# Patient Record
Sex: Male | Born: 1935 | Race: White | Hispanic: No | Marital: Single | Smoking: Never smoker
Health system: Southern US, Community
[De-identification: ages and names within clinical notes are randomized; demographics above are authoritative.]

## PROBLEM LIST (undated history)

## (undated) DIAGNOSIS — N186 End stage renal disease: Secondary | ICD-10-CM

## (undated) DIAGNOSIS — N289 Disorder of kidney and ureter, unspecified: Secondary | ICD-10-CM

## (undated) DIAGNOSIS — Z992 Dependence on renal dialysis: Secondary | ICD-10-CM

## (undated) DIAGNOSIS — I10 Essential (primary) hypertension: Secondary | ICD-10-CM

## (undated) HISTORY — PX: HIP SURGERY: SHX245

## (undated) HISTORY — PX: AV FISTULA PLACEMENT: SHX1204

---

## 2012-11-17 ENCOUNTER — Emergency Department: Payer: Self-pay | Admitting: Emergency Medicine

## 2012-11-17 LAB — URINALYSIS, COMPLETE
Bacteria: NONE SEEN
Bilirubin,UR: NEGATIVE
Hyaline Cast: 1
Ketone: NEGATIVE
Ph: 9 (ref 4.5–8.0)
RBC,UR: 1 /HPF (ref 0–5)
Specific Gravity: 1.01 (ref 1.003–1.030)
Squamous Epithelial: NONE SEEN
WBC UR: 2 /HPF (ref 0–5)

## 2012-11-17 LAB — COMPREHENSIVE METABOLIC PANEL
Albumin: 3.2 g/dL — ABNORMAL LOW (ref 3.4–5.0)
Alkaline Phosphatase: 80 U/L (ref 50–136)
Calcium, Total: 8.4 mg/dL — ABNORMAL LOW (ref 8.5–10.1)
Creatinine: 5.5 mg/dL — ABNORMAL HIGH (ref 0.60–1.30)
EGFR (African American): 10 — ABNORMAL LOW
EGFR (Non-African Amer.): 9 — ABNORMAL LOW
Glucose: 155 mg/dL — ABNORMAL HIGH (ref 65–99)
Osmolality: 284 (ref 275–301)
Potassium: 5 mmol/L (ref 3.5–5.1)
Total Protein: 6.2 g/dL — ABNORMAL LOW (ref 6.4–8.2)

## 2012-11-17 LAB — CBC
HCT: 33.4 % — ABNORMAL LOW (ref 40.0–52.0)
MCH: 31.2 pg (ref 26.0–34.0)
WBC: 4.9 10*3/uL (ref 3.8–10.6)

## 2013-03-19 ENCOUNTER — Emergency Department: Payer: Self-pay | Admitting: Emergency Medicine

## 2013-03-19 LAB — BASIC METABOLIC PANEL
Calcium, Total: 8.4 mg/dL — ABNORMAL LOW (ref 8.5–10.1)
Chloride: 101 mmol/L (ref 98–107)
Co2: 33 mmol/L — ABNORMAL HIGH (ref 21–32)
EGFR (African American): 14 — ABNORMAL LOW
Sodium: 139 mmol/L (ref 136–145)

## 2013-03-19 LAB — CBC
HGB: 11.3 g/dL — ABNORMAL LOW (ref 13.0–18.0)
MCV: 101 fL — ABNORMAL HIGH (ref 80–100)
Platelet: 62 10*3/uL — ABNORMAL LOW (ref 150–440)
RBC: 3.58 10*6/uL — ABNORMAL LOW (ref 4.40–5.90)
RDW: 15.1 % — ABNORMAL HIGH (ref 11.5–14.5)
WBC: 4.6 10*3/uL (ref 3.8–10.6)

## 2013-03-19 LAB — TROPONIN I: Troponin-I: 0.02 ng/mL

## 2013-04-22 ENCOUNTER — Ambulatory Visit: Payer: Self-pay | Admitting: Hematology and Oncology

## 2013-04-22 LAB — CBC CANCER CENTER
Basophil #: 0 x10 3/mm (ref 0.0–0.1)
Eosinophil %: 4 %
Lymphocyte #: 0.8 x10 3/mm — ABNORMAL LOW (ref 1.0–3.6)
MCH: 33.4 pg (ref 26.0–34.0)
MCV: 102 fL — ABNORMAL HIGH (ref 80–100)
Monocyte #: 0.2 x10 3/mm (ref 0.2–1.0)
Monocyte %: 5.4 %
Neutrophil #: 2.9 x10 3/mm (ref 1.4–6.5)
Neutrophil %: 70.2 %
Platelet: 113 x10 3/mm — ABNORMAL LOW (ref 150–440)
RBC: 3.22 10*6/uL — ABNORMAL LOW (ref 4.40–5.90)
RDW: 15.7 % — ABNORMAL HIGH (ref 11.5–14.5)
WBC: 4.2 x10 3/mm (ref 3.8–10.6)

## 2013-04-22 LAB — IRON AND TIBC
Iron Saturation: 36 %
Iron: 63 ug/dL — ABNORMAL LOW (ref 65–175)
Unbound Iron-Bind.Cap.: 110 ug/dL

## 2013-04-22 LAB — FOLATE: Folic Acid: 82 ng/mL (ref 3.1–100.0)

## 2013-05-10 ENCOUNTER — Ambulatory Visit: Payer: Self-pay | Admitting: Hematology and Oncology

## 2013-06-08 LAB — CBC CANCER CENTER
Basophil #: 0 x10 3/mm (ref 0.0–0.1)
Eosinophil #: 0.2 x10 3/mm (ref 0.0–0.7)
Eosinophil %: 2.7 %
HCT: 32.8 % — ABNORMAL LOW (ref 40.0–52.0)
Lymphocyte #: 0.9 x10 3/mm — ABNORMAL LOW (ref 1.0–3.6)
Lymphocyte %: 15.2 %
MCH: 34.3 pg — ABNORMAL HIGH (ref 26.0–34.0)
MCV: 106 fL — ABNORMAL HIGH (ref 80–100)
Monocyte #: 0.3 x10 3/mm (ref 0.2–1.0)
Monocyte %: 4.8 %
Neutrophil #: 4.4 x10 3/mm (ref 1.4–6.5)
RBC: 3.09 10*6/uL — ABNORMAL LOW (ref 4.40–5.90)
RDW: 15.7 % — ABNORMAL HIGH (ref 11.5–14.5)
WBC: 5.8 x10 3/mm (ref 3.8–10.6)

## 2013-06-09 ENCOUNTER — Ambulatory Visit: Payer: Self-pay | Admitting: Hematology and Oncology

## 2013-06-20 ENCOUNTER — Emergency Department: Payer: Self-pay | Admitting: Emergency Medicine

## 2013-06-21 ENCOUNTER — Observation Stay: Payer: Self-pay | Admitting: Internal Medicine

## 2013-06-21 LAB — BASIC METABOLIC PANEL
BUN: 42 mg/dL — ABNORMAL HIGH (ref 7–18)
Chloride: 96 mmol/L — ABNORMAL LOW (ref 98–107)
Co2: 33 mmol/L — ABNORMAL HIGH (ref 21–32)
Creatinine: 5.3 mg/dL — ABNORMAL HIGH (ref 0.60–1.30)
EGFR (African American): 10 — ABNORMAL LOW
EGFR (Non-African Amer.): 9 — ABNORMAL LOW
Osmolality: 283 (ref 275–301)
Potassium: 4.3 mmol/L (ref 3.5–5.1)
Sodium: 135 mmol/L — ABNORMAL LOW (ref 136–145)

## 2013-06-21 LAB — TROPONIN I
Troponin-I: 0.04 ng/mL
Troponin-I: 0.04 ng/mL

## 2013-06-21 LAB — CBC
HCT: 31.7 % — ABNORMAL LOW (ref 40.0–52.0)
MCH: 35.9 pg — ABNORMAL HIGH (ref 26.0–34.0)
MCHC: 34 g/dL (ref 32.0–36.0)
RBC: 3 10*6/uL — ABNORMAL LOW (ref 4.40–5.90)

## 2013-06-22 DIAGNOSIS — I519 Heart disease, unspecified: Secondary | ICD-10-CM

## 2013-06-22 LAB — CBC WITH DIFFERENTIAL/PLATELET
Basophil #: 0 10*3/uL (ref 0.0–0.1)
Basophil %: 0.6 %
Eosinophil #: 0.2 10*3/uL (ref 0.0–0.7)
Eosinophil %: 2.6 %
HGB: 10 g/dL — ABNORMAL LOW (ref 13.0–18.0)
Lymphocyte #: 1.2 10*3/uL (ref 1.0–3.6)
Lymphocyte %: 18 %
MCH: 36.1 pg — ABNORMAL HIGH (ref 26.0–34.0)
MCHC: 34 g/dL (ref 32.0–36.0)
MCV: 106 fL — ABNORMAL HIGH (ref 80–100)
Monocyte #: 0.4 x10 3/mm (ref 0.2–1.0)
Neutrophil #: 5 10*3/uL (ref 1.4–6.5)
RBC: 2.77 10*6/uL — ABNORMAL LOW (ref 4.40–5.90)
RDW: 15.6 % — ABNORMAL HIGH (ref 11.5–14.5)
WBC: 6.8 10*3/uL (ref 3.8–10.6)

## 2013-06-22 LAB — CK-MB: CK-MB: 4.8 ng/mL — ABNORMAL HIGH (ref 0.5–3.6)

## 2013-06-22 LAB — BASIC METABOLIC PANEL
Anion Gap: 10 (ref 7–16)
BUN: 53 mg/dL — ABNORMAL HIGH (ref 7–18)
Calcium, Total: 8.5 mg/dL (ref 8.5–10.1)
Chloride: 96 mmol/L — ABNORMAL LOW (ref 98–107)
Glucose: 93 mg/dL (ref 65–99)
Osmolality: 286 (ref 275–301)
Sodium: 136 mmol/L (ref 136–145)

## 2013-06-22 LAB — TROPONIN I: Troponin-I: 0.04 ng/mL

## 2013-06-23 LAB — CBC
HCT: 31 % — ABNORMAL LOW (ref 40.0–52.0)
MCH: 35.9 pg — ABNORMAL HIGH (ref 26.0–34.0)
MCHC: 33.9 g/dL (ref 32.0–36.0)
MCV: 106 fL — ABNORMAL HIGH (ref 80–100)
Platelet: 111 10*3/uL — ABNORMAL LOW (ref 150–440)
RBC: 2.93 10*6/uL — ABNORMAL LOW (ref 4.40–5.90)
RDW: 16 % — ABNORMAL HIGH (ref 11.5–14.5)
WBC: 7.9 10*3/uL (ref 3.8–10.6)

## 2013-06-23 LAB — COMPREHENSIVE METABOLIC PANEL
Albumin: 3.1 g/dL — ABNORMAL LOW (ref 3.4–5.0)
Anion Gap: 6 — ABNORMAL LOW (ref 7–16)
Bilirubin,Total: 0.6 mg/dL (ref 0.2–1.0)
Calcium, Total: 8.7 mg/dL (ref 8.5–10.1)
Chloride: 97 mmol/L — ABNORMAL LOW (ref 98–107)
EGFR (African American): 16 — ABNORMAL LOW
Osmolality: 277 (ref 275–301)
Potassium: 4.2 mmol/L (ref 3.5–5.1)
SGPT (ALT): 24 U/L (ref 12–78)
Total Protein: 6.6 g/dL (ref 6.4–8.2)

## 2013-06-23 LAB — CK TOTAL AND CKMB (NOT AT ARMC): CK, Total: 163 U/L (ref 35–232)

## 2013-06-24 ENCOUNTER — Observation Stay: Payer: Self-pay | Admitting: Internal Medicine

## 2013-06-24 LAB — CBC WITH DIFFERENTIAL/PLATELET
Basophil #: 0 10*3/uL (ref 0.0–0.1)
Basophil %: 0.4 %
Eosinophil %: 1.2 %
Lymphocyte %: 12 %
MCH: 36.1 pg — ABNORMAL HIGH (ref 26.0–34.0)
MCHC: 34.1 g/dL (ref 32.0–36.0)
Neutrophil #: 5.1 10*3/uL (ref 1.4–6.5)
RDW: 15.6 % — ABNORMAL HIGH (ref 11.5–14.5)
WBC: 6.4 10*3/uL (ref 3.8–10.6)

## 2013-06-24 LAB — BASIC METABOLIC PANEL
Anion Gap: 7 (ref 7–16)
Calcium, Total: 8.4 mg/dL — ABNORMAL LOW (ref 8.5–10.1)
Chloride: 96 mmol/L — ABNORMAL LOW (ref 98–107)
Co2: 31 mmol/L (ref 21–32)
EGFR (Non-African Amer.): 9 — ABNORMAL LOW
Osmolality: 278 (ref 275–301)
Sodium: 134 mmol/L — ABNORMAL LOW (ref 136–145)

## 2013-06-26 LAB — CULTURE, BLOOD (SINGLE)

## 2013-09-13 ENCOUNTER — Inpatient Hospital Stay: Payer: Self-pay | Admitting: Specialist

## 2013-09-13 LAB — COMPREHENSIVE METABOLIC PANEL
ALK PHOS: 122 U/L — AB
Albumin: 3.6 g/dL (ref 3.4–5.0)
Anion Gap: 4 — ABNORMAL LOW (ref 7–16)
BUN: 27 mg/dL — AB (ref 7–18)
Bilirubin,Total: 0.5 mg/dL (ref 0.2–1.0)
Calcium, Total: 8.7 mg/dL (ref 8.5–10.1)
Chloride: 95 mmol/L — ABNORMAL LOW (ref 98–107)
Co2: 34 mmol/L — ABNORMAL HIGH (ref 21–32)
Creatinine: 4.25 mg/dL — ABNORMAL HIGH (ref 0.60–1.30)
EGFR (African American): 13 — ABNORMAL LOW
GFR CALC NON AF AMER: 12 — AB
Glucose: 122 mg/dL — ABNORMAL HIGH (ref 65–99)
Osmolality: 273 (ref 275–301)
POTASSIUM: 3.9 mmol/L (ref 3.5–5.1)
SGOT(AST): 23 U/L (ref 15–37)
SGPT (ALT): 17 U/L (ref 12–78)
Sodium: 133 mmol/L — ABNORMAL LOW (ref 136–145)
TOTAL PROTEIN: 7.2 g/dL (ref 6.4–8.2)

## 2013-09-13 LAB — PROTIME-INR
INR: 1
Prothrombin Time: 13 secs (ref 11.5–14.7)

## 2013-09-13 LAB — CBC WITH DIFFERENTIAL/PLATELET
Basophil #: 0 10*3/uL (ref 0.0–0.1)
Basophil %: 0.7 %
EOS PCT: 1.1 %
Eosinophil #: 0.1 10*3/uL (ref 0.0–0.7)
HCT: 37.6 % — AB (ref 40.0–52.0)
HGB: 12.5 g/dL — AB (ref 13.0–18.0)
Lymphocyte #: 0.1 10*3/uL — ABNORMAL LOW (ref 1.0–3.6)
Lymphocyte %: 2.8 %
MCH: 34.1 pg — ABNORMAL HIGH (ref 26.0–34.0)
MCHC: 33.1 g/dL (ref 32.0–36.0)
MCV: 103 fL — ABNORMAL HIGH (ref 80–100)
MONOS PCT: 2.9 %
Monocyte #: 0.1 x10 3/mm — ABNORMAL LOW (ref 0.2–1.0)
Neutrophil #: 4.9 10*3/uL (ref 1.4–6.5)
Neutrophil %: 92.5 %
Platelet: 83 10*3/uL — ABNORMAL LOW (ref 150–440)
RBC: 3.65 10*6/uL — ABNORMAL LOW (ref 4.40–5.90)
RDW: 14.7 % — AB (ref 11.5–14.5)
WBC: 5.3 10*3/uL (ref 3.8–10.6)

## 2013-09-13 LAB — TROPONIN I: Troponin-I: <0.02 ng/mL

## 2013-09-13 LAB — RAPID INFLUENZA A&B ANTIGENS

## 2013-09-14 LAB — BASIC METABOLIC PANEL
Anion Gap: 9 (ref 7–16)
BUN: 38 mg/dL — ABNORMAL HIGH (ref 7–18)
CREATININE: 5.42 mg/dL — AB (ref 0.60–1.30)
Calcium, Total: 8.2 mg/dL — ABNORMAL LOW (ref 8.5–10.1)
Chloride: 92 mmol/L — ABNORMAL LOW (ref 98–107)
Co2: 33 mmol/L — ABNORMAL HIGH (ref 21–32)
EGFR (African American): 10 — ABNORMAL LOW
GFR CALC NON AF AMER: 9 — AB
Glucose: 83 mg/dL (ref 65–99)
OSMOLALITY: 276 (ref 275–301)
Potassium: 4.3 mmol/L (ref 3.5–5.1)
Sodium: 134 mmol/L — ABNORMAL LOW (ref 136–145)

## 2013-09-14 LAB — MAGNESIUM: Magnesium: 1.7 mg/dL — ABNORMAL LOW

## 2013-09-14 LAB — CBC WITH DIFFERENTIAL/PLATELET
BASOS ABS: 0 10*3/uL (ref 0.0–0.1)
BASOS PCT: 1.2 %
Eosinophil #: 0 10*3/uL (ref 0.0–0.7)
Eosinophil %: 0.5 %
HCT: 33.5 % — ABNORMAL LOW (ref 40.0–52.0)
HGB: 11.2 g/dL — ABNORMAL LOW (ref 13.0–18.0)
Lymphocyte #: 0.3 10*3/uL — ABNORMAL LOW (ref 1.0–3.6)
Lymphocyte %: 7.3 %
MCH: 34.7 pg — ABNORMAL HIGH (ref 26.0–34.0)
MCHC: 33.3 g/dL (ref 32.0–36.0)
MCV: 104 fL — ABNORMAL HIGH (ref 80–100)
MONOS PCT: 5.8 %
Monocyte #: 0.2 x10 3/mm (ref 0.2–1.0)
NEUTROS ABS: 3.1 10*3/uL (ref 1.4–6.5)
Neutrophil %: 85.2 %
PLATELETS: 66 10*3/uL — AB (ref 150–440)
RBC: 3.21 10*6/uL — AB (ref 4.40–5.90)
RDW: 14.9 % — ABNORMAL HIGH (ref 11.5–14.5)
WBC: 3.7 10*3/uL — ABNORMAL LOW (ref 3.8–10.6)

## 2013-09-15 LAB — PHOSPHORUS: PHOSPHORUS: 4.9 mg/dL (ref 2.5–4.9)

## 2013-09-18 LAB — CULTURE, BLOOD (SINGLE)

## 2013-10-06 ENCOUNTER — Emergency Department: Payer: Self-pay | Admitting: Emergency Medicine

## 2013-12-03 ENCOUNTER — Encounter: Payer: Self-pay | Admitting: Podiatry

## 2013-12-03 ENCOUNTER — Ambulatory Visit (INDEPENDENT_AMBULATORY_CARE_PROVIDER_SITE_OTHER): Payer: Medicaid Other | Admitting: Podiatry

## 2013-12-03 VITALS — Resp 16 | Ht 70.0 in | Wt 160.0 lb

## 2013-12-03 DIAGNOSIS — B351 Tinea unguium: Secondary | ICD-10-CM

## 2013-12-03 NOTE — Progress Notes (Signed)
Subjective:     Patient ID: Walter [name], male   DOB: 06/17/1922, 80 y.o.   MRN: [id number]  HPI patient presents with nails that bother him 1-5 both feet not tender at   Review of Systems     Objective:   Physical Exam Neurovascular status unchanged well oriented x3 with thick nailbeds 1-5 both feet    Assessment:     Mycotic nail infection with no pain 1-5    Plan:     Debridement nailbeds thick 1-5 both feet

## 2013-12-03 NOTE — Progress Notes (Signed)
   Subjective:    Patient ID: Walter [name], male    DOB: 10/01/1921, 80 y.o.   MRN: [id number]  HPI Comments: N 0 L trim toenails D yrs  O slowly C worse A 0 T pt trims toenails     Review of Systems  All other systems reviewed and are negative.       Objective:   Physical Exam        Assessment & Plan:

## 2014-02-09 ENCOUNTER — Emergency Department: Payer: Self-pay | Admitting: Emergency Medicine

## 2014-03-01 ENCOUNTER — Ambulatory Visit: Payer: Medicaid Other | Admitting: Podiatry

## 2014-03-08 ENCOUNTER — Ambulatory Visit: Payer: Self-pay | Admitting: Geriatric Medicine

## 2014-03-08 ENCOUNTER — Ambulatory Visit: Payer: Medicaid Other | Admitting: Podiatry

## 2014-03-11 ENCOUNTER — Ambulatory Visit (INDEPENDENT_AMBULATORY_CARE_PROVIDER_SITE_OTHER): Payer: Medicaid Other | Admitting: Podiatry

## 2014-03-11 VITALS — BP 199/88 | HR 72 | Resp 16

## 2014-03-11 DIAGNOSIS — M79609 Pain in unspecified limb: Secondary | ICD-10-CM

## 2014-03-11 DIAGNOSIS — B351 Tinea unguium: Secondary | ICD-10-CM

## 2014-03-11 DIAGNOSIS — M79676 Pain in unspecified toe(s): Secondary | ICD-10-CM

## 2014-03-11 NOTE — Progress Notes (Signed)
Subjective:     Patient ID: Walter [name], male   DOB: 07/23/1922, 80 y.o.   MRN: [id number]  HPI patient presents with thick nailbeds 1-5 both feet that are painful and he has no ability to cut   Review of Systems     Objective:   Physical Exam Neurovascular status unchanged with thick yellow brittle nailbeds 1-5 both feet    Assessment:     Mycotic nail infection 1-5 both feet    Plan:     Debridement of painful nailbeds 1-5 both feet with no iatrogenic bleeding noted

## 2014-07-09 ENCOUNTER — Emergency Department: Payer: Self-pay | Admitting: Emergency Medicine

## 2014-10-19 ENCOUNTER — Emergency Department: Payer: Self-pay | Admitting: Emergency Medicine

## 2014-10-19 ENCOUNTER — Observation Stay (HOSPITAL_COMMUNITY)
Admission: EM | Admit: 2014-10-19 | Discharge: 2014-10-19 | Payer: Medicare Other | Source: Other Acute Inpatient Hospital | Attending: Neurosurgery | Admitting: Neurosurgery

## 2014-10-19 DIAGNOSIS — Y939 Activity, unspecified: Secondary | ICD-10-CM | POA: Diagnosis not present

## 2014-10-19 DIAGNOSIS — Z7952 Long term (current) use of systemic steroids: Secondary | ICD-10-CM | POA: Insufficient documentation

## 2014-10-19 DIAGNOSIS — W1830XA Fall on same level, unspecified, initial encounter: Secondary | ICD-10-CM | POA: Diagnosis not present

## 2014-10-19 DIAGNOSIS — S06300A Unspecified focal traumatic brain injury without loss of consciousness, initial encounter: Secondary | ICD-10-CM | POA: Diagnosis not present

## 2014-10-19 DIAGNOSIS — Y929 Unspecified place or not applicable: Secondary | ICD-10-CM | POA: Diagnosis not present

## 2014-10-19 DIAGNOSIS — I619 Nontraumatic intracerebral hemorrhage, unspecified: Secondary | ICD-10-CM

## 2014-10-19 DIAGNOSIS — Z79899 Other long term (current) drug therapy: Secondary | ICD-10-CM | POA: Insufficient documentation

## 2014-10-19 MED ORDER — OXYCODONE HCL 5 MG PO TABS: 5.0000 mg | ORAL_TABLET | ORAL | Status: DC | PRN

## 2014-10-19 MED ORDER — ACETAMINOPHEN 650 MG RE SUPP: 650.0000 mg | Freq: Four times a day (QID) | RECTAL | Status: DC | PRN

## 2014-10-19 MED ORDER — ACETAMINOPHEN 325 MG PO TABS: 650.0000 mg | ORAL_TABLET | Freq: Four times a day (QID) | ORAL | Status: DC | PRN

## 2014-10-19 MED ORDER — ONDANSETRON HCL 4 MG/2ML IJ SOLN: 4.0000 mg | Freq: Four times a day (QID) | INTRAMUSCULAR | Status: DC | PRN

## 2014-10-19 MED ORDER — ONDANSETRON HCL 4 MG PO TABS: 4.0000 mg | ORAL_TABLET | Freq: Four times a day (QID) | ORAL | Status: DC | PRN

## 2014-10-19 MED ORDER — SENNA 8.6 MG PO TABS
1.0000 | ORAL_TABLET | Freq: Two times a day (BID) | ORAL | Status: DC
  Filled 2014-10-19: qty 1

## 2014-10-19 NOTE — H&P (Signed)
Walter [name] is an 81 y.o. male.   Chief Complaint: whom tripped earlier today, without loss of consciousness HPI: Fall today. No neurologic problems exhibited at [facility] per ER physician. Head CT ordered due to mechanism. Walter [name] maintained a normal GCS of 15 during his ER evaluation and his transfer to [facility].   No past medical history on file.  No past surgical history on file.  No family history on file. Social History:  reports that Walter [name] has never smoked. Walter [name] does not have any smokeless tobacco history on file. Walter [name] reports that Walter [name] does not drink alcohol. His drug history is not on file.  Allergies: No Known Allergies  Medications Prior to Admission  Medication Sig Dispense Refill  . allopurinol (ZYLOPRIM) 100 MG tablet     . calcium acetate (PHOSLO) 667 MG capsule     . labetalol (NORMODYNE) 100 MG tablet     . levofloxacin (LEVAQUIN) 250 MG tablet     . lisinopril (PRINIVIL,ZESTRIL) 20 MG tablet     . omeprazole (PRILOSEC) 40 MG capsule     . predniSONE (DELTASONE) 10 MG tablet     . terazosin (HYTRIN) 2 MG capsule       No results found for this or any previous visit (from the past 48 hour(s)). No results found.  Review of Systems  Constitutional: Negative.   HENT: Negative.   Eyes: Negative.   Respiratory: Negative.   Cardiovascular: Negative.   Gastrointestinal: Negative.   Genitourinary:       Renal failure, Renal tumor  Musculoskeletal: Positive for falls.  Skin: Negative.   Neurological: Negative.   Endo/Heme/Allergies: Negative.   Psychiatric/Behavioral: Negative.     Blood pressure 175/79, pulse 65, temperature 97.8 F (36.6 C), temperature source Oral, resp. rate 18, SpO2 94 %. Physical Exam  Constitutional: Walter [name] is oriented to person, place, and time. Walter [name] appears well-developed and well-nourished. No distress.  HENT:  Head: Normocephalic.  Right Ear: External ear normal.  Left Ear: External ear normal.  Nose: Nose normal.  Mouth/Throat: Oropharynx  is clear and moist.  Eyes: Conjunctivae and EOM are normal. Pupils are equal, round, and reactive to light.  Neck: Normal range of motion. Neck supple.  Cardiovascular: Normal rate, regular rhythm and normal heart sounds.   Respiratory: Effort normal and breath sounds normal.  GI: Soft. Bowel sounds are normal.  Musculoskeletal: Normal range of motion.  Neurological: Walter [name] is alert and oriented to person, place, and time. Walter [name] has normal strength and normal reflexes. Walter [name] is not disoriented. Walter [name] displays no tremor and normal reflexes. A cranial nerve deficit is present. Walter [name] exhibits normal muscle tone. Walter [name] displays no seizure activity. Coordination and gait normal. GCS eye subscore is 4. GCS verbal subscore is 5. GCS motor subscore is 6. Walter [name] displays no Babinski's sign on the right side. Walter [name] displays no Babinski's sign on the left side.  Skin: Skin is warm and dry. Walter [name] is not diaphoretic.  Psychiatric: Walter [name] has a normal mood and affect. His behavior is normal. Judgment and thought content normal.     Assessment/Plan Walter [name] neurologically has a very small ICH in the left basal ganglia. Walter [name] has no epidural blood, no subdural blood, No neurosurgical intervention indicated. Will discharge today. Walter [name] has a normal neurological exam.   Oyinkansola Truax L 10/19/2014, 4:34 PM

## 2014-10-19 NOTE — Discharge Summary (Signed)
Physician Discharge Summary  Patient ID: Walter [name] MRN: [id number] DOB/AGE: 79/12/1921 79 y.o.  Admit date: 10/19/2014 Discharge date: 10/19/2014  Admission Diagnoses:traumatic ICH  Discharge Diagnoses:  Principal Problem:   ICH (intracerebral hemorrhage)   Discharged Condition: good  Hospital Course: Mr. Walter [name] was transferred for a very small ICH. He did not need neurologic care. He had and has a normal neurologic exam. He was given a head injury instruction sheet, and will return to his assisted living home.   Consults: None  Significant Diagnostic Studies: none  Treatments: none  Discharge Exam: Blood pressure 175/79, pulse 65, temperature 97.8 F (36.6 C), temperature source Oral, resp. rate 18, SpO2 94 %. General appearance: alert, cooperative, appears stated age and no distress Neurologic: Alert and oriented X 3, normal strength and tone. Normal symmetric reflexes. Normal coordination and gait  Disposition: Final discharge disposition not confirmed     Medication List    TAKE these medications        allopurinol 100 MG tablet  Commonly known as:  ZYLOPRIM     calcium acetate 667 MG capsule  Commonly known as:  PHOSLO     labetalol 100 MG tablet  Commonly known as:  NORMODYNE     levofloxacin 250 MG tablet  Commonly known as:  LEVAQUIN     lisinopril 20 MG tablet  Commonly known as:  PRINIVIL,ZESTRIL     omeprazole 40 MG capsule  Commonly known as:  PRILOSEC     predniSONE 10 MG tablet  Commonly known as:  DELTASONE     terazosin 2 MG capsule  Commonly known as:  HYTRIN           Follow-up Information    Follow up with Lailanie Hasley L, MD.   Specialty:  Neurosurgery   Why:  If symptoms worsen   Contact information:   [address] [place] [address] [phone]       Signed: Carmela HurtCABBELL,Kaneesha Constantino L 10/19/2014, 5:24 PM

## 2014-12-23 ENCOUNTER — Emergency Department
Admit: 2014-12-23 | Disposition: A | Discharge status: Skilled Nursing Facility | Payer: Self-pay | Admitting: Emergency Medicine

## 2014-12-23 LAB — BASIC METABOLIC PANEL
Anion Gap: 8 (ref 7–16)
BUN: 30 mg/dL — ABNORMAL HIGH
CALCIUM: 8.2 mg/dL — AB
CHLORIDE: 96 mmol/L — AB
CREATININE: 3.9 mg/dL — AB
Co2: 35 mmol/L — ABNORMAL HIGH
EGFR (African American): 15 — ABNORMAL LOW
GFR CALC NON AF AMER: 13 — AB
Glucose: 149 mg/dL — ABNORMAL HIGH
POTASSIUM: 3 mmol/L — AB
Sodium: 139 mmol/L

## 2014-12-23 LAB — CBC
HCT: 36.2 % — ABNORMAL LOW (ref 40.0–52.0)
HGB: 11.6 g/dL — AB (ref 13.0–18.0)
MCH: 31.6 pg (ref 26.0–34.0)
MCHC: 32.2 g/dL (ref 32.0–36.0)
MCV: 98 fL (ref 80–100)
Platelet: 77 10*3/uL — ABNORMAL LOW (ref 150–440)
RBC: 3.68 10*6/uL — AB (ref 4.40–5.90)
RDW: 17.1 % — ABNORMAL HIGH (ref 11.5–14.5)
WBC: 4.2 10*3/uL (ref 3.8–10.6)

## 2014-12-30 NOTE — H&P (Signed)
PATIENT NAME:  Walter [name], Walter [name] MR#:  [id number] DATE OF BIRTH:  [birth year]  DATE OF ADMISSION:  06/23/2013    PRIMARY CARE PHYSICIAN: Verlin GrillsKristina Carr, PA REFERRING PHYSICIAN:  Wille CelesteKathryn R. Andrukonis, PA-C  CHIEF COMPLAINT:   Hip pain.   HISTORY OF PRESENT ILLNESS: Walter [name] is an 81 year old male with history of end-stage renal disease on hemodialysis, hypertension, who was admitted here on 06/21/2013 for an episode of syncope and thought to be from narcotic medication, which was started one day prior to admission. The patient was discharged the following day. The patient had dialysis, he was waiting for a ride. When the patient stood up and tried to walk outside,  loss balance and fell down, hit is head and also hit right side of the hip. He started to experience severe pain, unable to stand up. Concerning this, the patient is brought to the Emergency Department. On work-up in the Emergency Department showed a right pelvic rami fracture. I tried to contact the nursing home and  was unable to contact the administration. For this reason,  patient will be admitted for further placement. The patient lives in assisted living facility.  He usually walks by himself.   PAST MEDICAL HISTORY: 1.  End-stage renal disease on hemodialysis Monday, Wednesday and Friday.  2.  Hypertension.  3.  Anemia of chronic disease.  4.  Chronic thrombocytopenia.  5.  Gout.  6.  Gastroesophageal reflux disease.   7.  Benign prosthetic hypertrophy.   HOME MEDICATIONS: 1.  Allopurinol 100 mg daily.  2.  B complex 1 tablet daily.  3.  Nifedipine 30 mg daily.  4.  Omeprazole 40 mg daily.  5.  Vitamin B12, 1,000 mcg daily.  6.  Vitamin C 500 mg daily.   7.  Terazosin 2 mg p.o. daily.  8.  Amoxicillin 500 mg p.o. b.i.d. 9.  Acidophilus 1 tablet daily.  10.  PhosLo 667 capsules daily.  11.  Vitamin D3 400 international units daily.  12.  Lisinopril 20 mg daily.  13.  Labetalol 100 mg Tuesday, Thursday, Saturday and Sunday.   14.  Aspirin 81 mg daily.  15.  Labetalol 100 mg evening every day.  16.  Tramadol  50 mg every 8 hours as needed.   SOCIAL HISTORY: No history of smoking, drinking alcohol or using illicit drugs. He has never been married.  Lives in [facility] living facility [facility].    FAMILY HISTORY: Father died of heart attack at the age of 79,  mother lived to be 2984, died of old age.   REVIEW OF SYSTEMS: CONSTITUTIONAL: Has been experiencing generalized weakness.  EYES:  No change in vision.  ENT: No change in hearing.  RESPIRATORY: No cough, shortness of breath.  CARDIOVASCULAR: No chest pain, palpitations.  GASTROINTESTINAL: No nausea, vomiting, abdominal pain.  GENITOURINARY:  The patient has end-stage renal disease.   ENDOCRINE: No polyuria or polydipsia. Denies heat or  cold intolerance. No easy bruising or bleeding.  SKIN: No rashes or lesions.  MUSCULOSKELETAL: Has multiple joint pains and has history of osteoarthritis.  NEUROLOGIC: No weakness or numbness in any part of the body.   PHYSICAL EXAMINATION: GENERAL: This is a frail-looking male lying down in the bed, not in distress.  VITAL SIGNS: Temperature 98.8, pulse 69, blood pressure 164/70, respiratory rate of 18, oxygen saturation 96% on room air.  HEENT: Head normocephalic, atraumatic.  Conjunctivae normal. Pupils equal and react to light. Extraocular movements are intact. Mucous membranes moist. No  pharyngeal erythema.   NECK: Supple. No lymphadenopathy. No JVD. No bruits.  CHEST:  No focal tenderness.  LUNGS: Bilaterally clear to auscultation.  HEART: S1, S2, regular. No murmurs are heard.  ABDOMEN: Bowel sounds present. Soft, nontender, nondistended. No hepatosplenomegaly.  EXTREMITIES: No pedal edema. Pulses 2+. SKIN:  No rash or lesions.  MUSCULOSKELETAL: Has pain in the right leg.  NEURLOGIC:  The patient is alert, oriented to place, person and time. Cranial nerves II through XII intact. Motor 5/5 in upper and lower  extremities.   LABORATORY, DIAGNOSTIC AND RADIOLOGIC DATA:   Troponin 0.02. CK 163, CK-MB of 4.6.   CT head without contrast: No acute intracranial abnormality, diffuse cortical cerebral atrophy.   CT of the cervical spine: Multilevel spondylosis.   Chest x-ray, PA and lateral:  Acute cardiopulmonary disease.   ASSESSMENT AND PLAN: Walter [name] is an 81 year old male who comes to the Emergency Department after having a fall.  1.  Fall. This seems to be more mechanical from loss of balance. Admit the patient under observation, continue with pain management.  We will  continue for now with the tramadol. The patient does not seem to be in any distress. However, the patient will benefit from rehab,  especially considering the patient's multiple falls.  2.  End-stage renal disease on hemodialysis. The patient completed dialysis today. The patient will continue hemodialysis as an outpatient.  3.  Hypertension, moderately controlled. This could be secondary to pain. Continue the home medications and follow up.  4.  Keep the patient on deep vein thrombosis prophylaxis with heparin.    TIME SPENT: 40 minutes.   ____________________________ Susa Griffins, MD pv:cc D: 06/24/2013 00:04:00 ET T: 06/24/2013 00:48:30 ET  JOB#: [id number]  cc: Louretta Parma, NP Susa Griffins MD ELECTRONICALLY SIGNED 07/10/2013 3:04

## 2014-12-30 NOTE — Discharge Summary (Signed)
PATIENT NAME:  Walter [name], Walter [name] MR#:  [id number] DATE OF BIRTH:  1921/12/09  PRIMARY CARE PROVIDER: Verlin GrillsKristina Carr, NP.   DISCHARGE DIAGNOSES:  Pubic ramus fracture.  Mechanical fall.  End-stage renal disease on hemodialysis.  Hypertension.  Chronic thrombocytopenia.   IMAGING STUDIES:  Include: 1.  CT scan of the head without contrast which showed mild atrophy. No acute abnormalities.  2.  CT cervical spine without contrast showed degenerative joint disease but no dislocation or fractures.  3.  CT of the pelvis showed some cortical subtle irregularity of the superior right pubic ramus with subtle fracture.  4.  Mild prostatic enlargement.  5.  Ultrasound carotid Doppler showed no significant stenosis.   ADMITTING HISTORY AND PHYSICAL: Please see detailed [name] and P dictated by Dr. Heron NayVasireddy. In brief, an 81 year old patient with end-stage renal disease and recurrent syncope who presented to the hospital after having a fall. The patient went for his dialysis, after which he stood up, felt a little lightheaded, did not loose consciousness but did fall down, and presented to the hospital with complaints of hip pain. The patient was found to have a small right pubic ramus  fracture which was not amenable to surgery. The patient worked with PT who recommended discharging him back to his assisted living facility with outpatient physical therapy, and the patient is being discharged back to assisted living facility with home health with PT.   The patient's blood pressure was stable during the hospital stay, was seen by nurse. His dialysis was continued. CT of the head, ultrasound of the carotid Doppler, showed no significant findings. The patient was not started on any DVT prophylaxis with Lovenox or heparin secondary to his chronic thrombocytopenia with platelets being at 85.   PHYSICAL EXAMINATION:  Prior to discharge: HIP: Pain and tenderness to palpation of the hip.  CARDIAC: S1, S2, without any murmurs.  No edema.   DISCHARGE MEDICATIONS:  Include:  Allopurinol 100 mg oral once a day.  Nifedipine 30 mg oral once a day.  Omeprazole 40 mg daily.  Vitamin B 1200 mcg daily.  Vitamin C 500 mg daily.  Terazosin 2 mg oral daily.  Acidophilus oral tablet daily.  Calcium acetate 667 mg oral daily with food.  Vitamin D3 400 international units daily.  Lisinopril 20 mg daily.  Labetalol 100 mg oral once a day on Tuesday, Thursday, Saturday, and Sunday. Hold on dialysis days.  Aspirin 81 mg daily.  Tramadol 50 mg oral every 8 hours as needed for hip pain.   DISCHARGE INSTRUCTIONS: Renal diet. Activity as tolerated with assistance. No lifting of heavy weights. The patient was set up with home health and physical therapy will be continued.   FOLLOWUP:  Primary care physician in 1 to 2 weeks.   TIME SPENT ON DAY OF DISCHARGE IN DISCHARGE ACTIVITY: Was 40 minutes.   ____________________________ Molinda BailiffSrikar R. Monzerat Handler, MD srs:np D: 06/25/2013 15:06:53 ET T: 06/25/2013 17:04:08 ET JOB#: [id number]  cc: Wardell HeathSrikar R. Arionna Hoggard, MD, <Dictator> Louretta ParmaKristina N. Carr, NP Orie FishermanSRIKAR R Krystal Delduca MD ELECTRONICALLY SIGNED 07/04/2013 22:43

## 2014-12-30 NOTE — Discharge Summary (Signed)
PATIENT NAME:  Walter [name], Walter [name] MR#:  [id number] DATE OF BIRTH:  09-Nov-1921  ADMITTING PHYSICIAN: Hilda LiasVivek Sainani, MD DISCHARGING PHYSICIAN: Enid Baasadhika Magda Muise, MD  PRIMARY CARE PHYSICIAN: Verlin GrillsKristina Carr, nurse practitioner.  PRIMARY NEPHROLOGIST: [facility].   CONSULTATIONS IN THE HOSPITAL: Nephrology consultation by Dr. Mady HaagensenMunsoor Lateef.   DISCHARGE DIAGNOSES:  1.  Syncope, likely related to pain medication Norco.   2.  End-stage renal disease, on Monday/Wednesday/Friday hemodialysis.  3.  Hypertension.  4.  Anemia of chronic disease.  5.  Chronic thrombocytopenia.  6.  Gout.  7.  Gastroesophageal reflux disease.  8.  Benign prostatic hypertrophy.   DISCHARGE HOME MEDICATIONS:  1.  Allopurinol 100 mg p.o. daily.  2.  B complex 1 tablet p.o. daily.  3.  Nifedipine 30 mg p.o. daily. 4.  Omeprazole 40 mg p.o. daily.  5.  Vitamin B12 1000 mcg p.o. daily.  6.  Vitamin C 500 mg p.o. daily.  7.  Terazosin 2 mg p.o. daily.  8.  Amoxicillin 500 mg p.o. b.i.d.  9.  Acidophilus oral tablet 1 tablet p.o. daily.  10.  PhosLo 667 mg capsules daily with food.  11.  Vitamin D3, 400 international units p.o. daily.  12.  Lisinopril 20 mg p.o. daily.  13.  Labetalol 100 mg on Tuesday, Thursday, Saturday and Sunday.  14.  Aspirin 81 mg p.o. daily.  15.  Labetalol 100 mg in the evening every day.  16.  Tramadol 50 mg p.o. q. 8 hours p.r.n. for pain.   DISCHARGE DIET: Renal diet.   DISCHARGE ACTIVITY: As tolerated.   FOLLOWUP INSTRUCTIONS:  1.  Follow up for dialysis per schedule.  2.  PCP follow up in two weeks.   LABORATORIES AND IMAGING STUDIES PRIOR TO DISCHARGE: WBC 6.8, hemoglobin 10.0, hematocrit 29.4, platelet count 96.   Sodium 136, potassium 4.3, chloride 96, bicarb 30, BUN 53, creatinine 6.7, glucose 93 and calcium of 8.5. Troponins remained negative while in the hospital.   Ultrasound carotid revealed no hemodynamically significant stenosis.   Blood cultures are negative.   CT  of the head without contrast showing no acute ischemic or hemorrhagic infarction. Stable age-related atrophic changes are present.   Chest x-ray revealing cardiomegaly.  No evidence of overt CHF.   Echo Doppler revealing normal EF of left ventricle 60% to 65%. Impaired relaxation of LV diastolic filling noted. Normal right ventricular systolic pressures.   BRIEF HOSPITAL COURSE: Walter [name] is a very pleasant, 81 year old, elderly, Caucasian male with past medical history significant for hypertension, end-stage renal disease on Monday/Wednesday/Friday hemodialysis who presents to the hospital after experiencing a syncopal episode midway into dialysis treatment.   1.  Syncope. Could have been related to the pain medication Norco that was started a day prior to this admission. The patient has been having hip pain from degenerative arthritis and he was only taking Tylenol as needed but not helping with the pain, so just started the Norco. He has taken  the medication prior to dialysis. He does not appear fluid overloaded so was not dialyzed while in the hospital and will follow up for dialysis per his regular schedule on Wednesday. Has not had any further syncopal episodes while in the hospital. Being discharged on low-dose tramadol instead of Norco for his hip pain. That needs to be monitored as it decreases seizure threshold. Ultrasound carotid Dopplers were negative for any hemodynamically significant stenosis. Echo did not show any wall-motion abnormalities or low EF or severe aortic stenosis.  2.  End-stage renal disease, on Monday/Wednesday/Friday hemodialysis. The patient's last dialysis was prior to admission, was not dialyzed in the hospital as he did not appear to be fluid overloaded.  3.  Dialysis as an outpatient with [facility] nephrology.  4.  All his other home medications were continued. His course has been otherwise uneventful in the hospital.   DISCHARGE CONDITION: Stable.   DISCHARGE  DISPOSITION: [facility] [employer].   TIME SPENT ON DISCHARGE: 45 minutes.    ____________________________ Enid Baas, MD rk:np D: 06/23/2013 15:04:30 ET T: 06/23/2013 18:08:21 ET JOB#: [id number]  cc: Enid Baas, MD, <Dictator> [facility]  Enid Baas MD ELECTRONICALLY SIGNED 06/26/2013 12:06

## 2014-12-30 NOTE — H&P (Signed)
PATIENT NAME:  Walter [name], MAHLER MR#:  [id number] DATE OF BIRTH:  [birth year]  DATE OF ADMISSION:  06/21/2013  PRIMARY CARE PHYSICIAN: Louretta Parma, NP  CHIEF COMPLAINT:  Syncope.  HISTORY OF PRESENT ILLNESS: This is an 81 year old male who presents to the hospital with a syncopal episode at dialysis. The patient cannot recall the events himself. He apparently was at dialysis, had done 2 hours of treatment and then had a syncopal episode and was sent over to the ER for further evaluation. The patient denied any prodromal symptoms like chest pain, shortness of breath, nausea, vomiting, headache, dizziness, diaphoresis or any other associated symptoms. The patient was recently diagnosed with a dental infection and was started on amoxicillin. The patient also came to the hospital yesterday for some right hip pain and was started on some oral Norco for the pain. The patient now presents today with a syncopal episode with no prodromal symptoms as mentioned. Hospitalist services were contacted for further treatment and evaluation.   REVIEW OF SYSTEMS: CONSTITUTIONAL: No documented fever. No weight gain, no weight loss.  EYES: No blurred or double vision.  ENT: No tinnitus. No postnasal drip. No redness of the oropharynx.  RESPIRATORY: No cough, no wheeze, no hemoptysis, no dyspnea.  CARDIOVASCULAR: No chest pain, no orthopnea, no palpitations, no syncope.  GASTROINTESTINAL: No nausea, no vomiting or diarrhea. No abdominal pain. No melena or hematochezia.  GENITOURINARY: No dysuria or hematuria.  ENDOCRINE: No polyuria or nocturia. No heat or cold intolerance.  HEMATOLOGIC: No anemia, no bruising, no bleeding.  INTEGUMENTARY: No rashes. No lesions.  MUSCULOSKELETAL: No arthritis, no swelling, no gout.  NEUROLOGIC: No numbness or tingling. No ataxia. No seizure activity.  PSYCHIATRIC: No anxiety. No insomnia. No ADD.   PAST MEDICAL HISTORY: Consistent with end-stage renal disease on hemodialysis,  hypertension, history of renal cell carcinoma, gout, GERD, BPH.    ALLERGIES: The patient has no known drug allergies.   SOCIAL HISTORY: No smoking. No alcohol abuse. No illicit drug abuse. Resides at the [facility] assisted [facility].   FAMILY HISTORY: Both mother and father are deceased. Father died from an MI. Mother died from complications of cancer of unknown type. She also had a stroke.   CURRENT MEDICATIONS: As follows: Acidophilus 1 tab daily, allopurinol 100 mg daily, amoxicillin 500 mg b.i.d., calcium acetate 1 tab daily with food. Labetalol 100 mg in the evening, labetalol 100 mg on Tuesday, Thursday, Saturday, Sunday; to be held on dialysis days Monday, Wednesday, Friday. Nifedipine 30 mg daily, Norco 5/325 q.8hours as needed, omeprazole 40 mg daily, terazosin 2 mg at bedtime, vitamin B12 1000 mcg daily, vitamin C 500 mg daily, vitamin D3 400 international units daily.   PHYSICAL EXAMINATION:  Presently is as follows: VITAL SIGNS:  Temperature is 98.4, pulse 80, respirations 18, blood pressure 198/77, sats 93%  on room air.  GENERAL: He is a pleasant-appearing male in no apparent distress.  HEAD, EYES, EARS, NOSE AND THROAT: The patient is atraumatic, normocephalic. Extraocular muscles are intact. Pupils are equal and reactive eye to light. Sclerae anicteric. No conjunctival injection. No pharyngeal erythema.  NECK: Supple. There is no jugular venous distention, no bruits, no lymphadenopathy, no thyromegaly.  HEART: Regular rate and rhythm. No murmurs. No rubs. No clicks.  LUNGS: Clear to auscultation bilaterally. No rales or rhonchi. No wheezes.  ABDOMEN: Soft, flat, nontender, nondistended. Has good bowel sounds. No hepatosplenomegaly appreciated.  EXTREMITIES: No evidence of any cyanosis, clubbing or peripheral edema. Has +2 pedal and  radial pulses bilaterally. The patient also has a left upper extremity AV fistula with good bruit and good thrill, no evidence of any drainage or infection.   NEUROLOGIC: The patient is alert, awake and oriented x 3 with no focal motor or sensory deficits appreciated bilaterally.  SKIN: Moist and warm with no rashes appreciated.  LYMPHATIC: There is no cervical or axillary lymphadenopathy.   LABORATORY EXAM: Showed a serum glucose of 148, BUN 42, creatinine 5.3, sodium 135, potassium 4.3, chloride 96, bicarbonate 33. Troponin 0.04. White cell count 8.1, hemoglobin 10.8, hematocrit 31.7, platelet count 116.   IMAGING:  The patient did have a CT of the head done without contrast which showed no evidence of acute ischemic or hemorrhagic infarction, age-related atrophic changes.   ASSESSMENT AND PLAN: This is an 81 year old male with a history of end-stage renal disease on hemodialysis, hypertension, history of renal cell carcinoma, gout, gastroesophageal reflux disease, benign prostatic hypertrophy, who presents to the hospital due to a syncopal episode at dialysis today.  1.  Syncope. The exact etiology of the syncope is unclear. The patient's CT head is negative. He had no prodromal symptoms. His EKG does not show any evidence of arrhythmia. I will observe the patient overnight on telemetry and watch for any arrhythmias, follow serial cardiac markers, will get a 2-dimensional echocardiogram, also get a carotid duplex. There is some concern that this could be related to the pain meds he was started on yesterday. I will hold his Norco for now.  2.  End-stage renal disease on hemodialysis. Patient did not finish his dialysis today but his electrolytes are stable. No urgent need for hemodialysis presently. I will consult nephrology.  3.  Hypertension. The patient's blood pressure is somewhat uncontrolled presently as he has not taken his oral meds and did not finish his dialysis. For now, I will resume his labetalol and lisinopril and nifedipine.  If needed will consider p.r.n. IV hydralazine.  4.  Gout. No acute attack, continue with his allopurinol.  5.   Gastroesophageal reflux disease. Continue omeprazole.  6.  Benign prostatic hypertrophy. Continue terazosin.  7.  CODE STATUS: The patient is a full code.   TIME SPENT ON ADMISSION: 45 minutes.   ____________________________ Rolly PancakeVivek J. Cherlynn KaiserSainani, MD vjs:cs D: 06/21/2013 17:54:05 ET T: 06/21/2013 18:07:24 ET JOB#: [id number]  cc: Rolly PancakeVivek J. Cherlynn KaiserSainani, MD, <Dictator> Houston SirenVIVEK J Aleeha Boline MD ELECTRONICALLY SIGNED 06/22/2013 11:17

## 2014-12-31 NOTE — H&P (Signed)
PATIENT NAME:  Walter [name], Walter [name] MR#:  [id number] DATE OF BIRTH:  [birth year]  DATE OF ADMISSION:  09/13/2013  ADMITTING PHYSICIAN: Walter Lighter, MD  PRIMARY CARE PHYSICIAN AND PRIMARY NEPHROLOGIST: At [facility] nephrology.   CHIEF COMPLAINT: Sent in from dialysis after a syncopal episode.   HISTORY OF PRESENT ILLNESS: Walter [name] is an 81 year old Caucasian male with past medical history significant for end-stage renal disease on hemodialysis on Monday, Wednesday, Friday; hypertension, anemia of chronic disease, chronic thrombocytopenia gout and BPH.  [facility] had [facility] sync[facility]. The patient is a poor historian. His caregiver and his power of attorney is at bedside, who gives most of the history. The patient is from the [facility] assisted [facility] facility. At baseline, he has a cane but ambulates without any assistance, feeds himself and functional. He has been feeling weak over the last couple of days, usually feels weak after each dialysis episode. He has not noticed any difference. He has been feeling fine. No fevers or chills were reported. He has been having dry cough for the last couple of days and there were a lot isolated patients due to flue at the [facility] over the last couple of days. Today at dialysis he was weak and as soon as he finished the dialysis had a syncopal episode.  No report of any hypertension notified. Again, no fevers or chills. The patient had a low-grade fever after he was brought to the ER at 100.6 degrees Fahrenheit and his chest x-ray did reveal that he has a right lower lobe infiltrate so he is being admitted for possible pneumonia.   PAST MEDICAL HISTORY: 1.  End-stage renal disease, on Monday, Wednesday, Friday hemodialysis.  2.  Hypertension.  3.  Anemia of chronic disease.  4.  Chronic thrombocytopenia.  5.  Gout.  6.  Gastroesophageal reflux disease. 7.  Benign prostatic hypertrophy.  8.  History of renal cell carcinoma, status  post right sided nephrectomy.    PAST SURGICAL HISTORY: 1.  Right nephrectomy.  2.  AV fistula placed on left forearm.   ALLERGIES: No known drug allergies.   CURRENT HOME MEDICATIONS:  1.  Acidophilus oral tablet 1 tablet once a day in the morning.  2.  Allopurinol 100 mg p.o. daily.  3.  Amoxicillin 2 grams once before dental work as needed.  4.  Aspirin 81 mg p.o. daily.  5.  Folic acid 0.8 mg p.o. daily.  6.  Full-spectrum B vitamin with vitamin C daily.  7.  Labetalol 100 mg p.o. at bedtime.  8. Labetelol 100 mg p.o. on Tuesday, Thursday, Saturday and Sunday.  9.  Lactulose 15 mL b.i.d. p.r.n. for constipation.  10.  Lisinopril 20 mg p.o. daily.  11.  Multivitamin 1 tablet p.o. daily.  12.  Nifedipine extended-release 30 mg p.o. at bedtime.  13.  Omeprazole 40 mg p.o. daily.  14.  PhosLo 667 mg 1 tablet 3 times a day. 15.  Psyllium 5 grams mixed in juice or water once a day.  16.  Terazosin 2 mg once a day at bedtime.  17.  Vitamin B12 1000 mcg sublingual tablet 2 tablets once a day.  18.  Vitamin C 500 mg p.o. daily.   SOCIAL HISTORY: Lives at the [facility] assisted living facility. No history of any smoking or alcohol use.   FAMILY HISTORY: Father died from heart disease at the age of 67 and mom died from old age.   REVIEW OF  SYSTEMS: CONSTITUTIONAL: Positive for fever, fatigue and weakness.  EYES: No blurred vision, double vision, pain, inflammation or glaucoma.  ENT: No tinnitus, ear pain, hearing loss, epistaxis or discharge.  RESPIRATORY: Positive for cough. No wheeze, hemoptysis, COPD or dyspnea,  CARDIOVASCULAR: No chest pain, orthopnea, edema, arrhythmia, palpitations or syncope.  GASTROINTESTINAL: No nausea, vomiting, diarrhea, abdominal pain, hematemesis or melena.  GENITOURINARY: The patient does not make any urine.  ENDOCRINE: No polyuria, nocturia, thyroid problems, heat or cold intolerance.  HEMATOLOGY: Positive anemia of chronic disease. No easy bruising or  bleeding.  SKIN: No acne, rash or lesions. MUSCULOSKELETAL: Positive for history of gout and some pain in both feet.  NEUROLOGIC: No numbness, weakness, CVA, TIA or seizures.  PSYCHOLOGICAL: No anxiety, insomnia or depression.   PHYSICAL EXAMINATION: VITAL SIGNS: Temperature 100.6 degrees Fahrenheit, pulse 89, respirations 26, blood pressure 220/84, pulse ox 91% on room air.  GENERAL: Well-built, well-nourished male lying in bed, not in any acute distress.  HEENT: Normocephalic, atraumatic. Pupils are equal, round, reacting to light. Anicteric sclerae. Extraocular movements intact. Oropharynx clear without erythema, mass or exudates.  NECK: Supple. No thyromegaly, JVD or carotid bruits. No lymphadenopathy.  LUNGS: Moving air bilaterally. No wheeze or crackles. No use of accessory muscles for breathing.  CARDIOVASCULAR: S1 and S2. Regular rate and rhythm. A 2/6 systolic murmur heard. No rubs or gallops.  ABDOMEN: Soft, nontender, nondistended. No hepatosplenomegaly. Normal bowel sounds.  EXTREMITIES: No pedal edema. No clubbing or cyanosis. Has 2+ dorsalis pedis pulses palpable bilaterally.  SKIN: No acne, rash or lesions.  LYMPHATICS: No cervical lymphadenopathy.  NEUROLOGIC: Cranial nerves remain intact. No focal motor or sensory deficits.  PSYCHOLOGICAL: The patient is listless, slow to respond but alert, oriented x 3.   LABORATORY, DIAGNOSTIC AND RADIOLOGICAL DATA:  1.  WBC 5.3, hemoglobin 12.5, hematocrit 37.6, platelet count is 83.  2.  Sodium 133, potassium 3.9, chloride 95, bicarb 34, BUN 27, creatinine 4.25, glucose 122 and calcium of 8.7.  3.  ALT 17, AST 23, alk phos 122, total bili 3.5 and albumin of 3.6.  4.  Troponin was less than 0.02.   5.  Influenza test is negative.  6.  CT of the head without contrast showing atrophy with small residual chronic ischemic changes of the cerebral white matter. No acute intracranial abnormality noted.  7.  Chest x-ray revealing cardiomegaly  with right greater than left basilar airspace disease. Differential considerations could be CHF with estimated pulmonary edema versus pneumonia.    ASSESSMENT AND PLAN: An 81 year old male with end-stage renal disease, on hemodialysis, hypertension, anemia of chronic disease, being admitted for possible pneumonia and syncope.  1.  Syncope post dialysis. The patient has had a history of syncope post dialysis in the past. He gets weaker after dialysis. No evidence of any hypotension, possibly related to underlying infection.  2.  Pneumonia as noted on chest x-ray.  Could be pneumonia versus asymmetric edema; however, with the low-grade fever will cover for pneumonia. Blood cultures have been drawn. Gave him vancomycin just in case if he has any infection from the fistula. If cultures are negative, vancomycin can be discontinued.  He is also being started on Levaquin for possible pneumonia as noted on chest x-ray.  3.  End-stage renal disease on hemodialysis on Monday, Wednesday, Friday schedule. Last dialysis was today, on Monday. Next hemodialysis is on Wednesday.  Nephrology has been consulted for the same.  4.  Malignant hypertension. Will give IV hydralazine p.r.n., continue all  his home medications for blood pressure.  5.  Anemia of chronic disease, seems to be stable at this time.  6.  Chronic thrombocytopenia, also appears to be stable at this time.  7.  Gastroesophageal reflux disease.  Continue Protonix.  8.  CODE STATUS: Full code.   TIME SPENT ON ADMISSION: 50 minutes.   ____________________________ Walter Lighter, MD rk:cs D: 09/13/2013 58:59:29 ET T: 09/13/2013 20:35:53 ET JOB#: [id number]  cc: Walter Lighter, MD, <Dictator> [facility] Nephrology Walter Lighter MD ELECTRONICALLY SIGNED 09/21/2013 15:00

## 2014-12-31 NOTE — Discharge Summary (Signed)
PATIENT NAME:  Walter Walter [name], Walter Walter [name] MR#:  [id number] DATE OF BIRTH:  21-Jan-1922  DATE OF ADMISSION:  09/13/2013 DATE OF DISCHARGE:  09/16/2013  For a detailed note, please take a look at the history and physical done on admission by Dr. Nemiah Commander.   DIAGNOSES AT DISCHARGE: Altered mental status and confusion, likely secondary to pneumonia. Pneumonia. End-stage renal disease on hemodialysis, Monday, Wednesday, Friday. Hypertension. History of gout. Hypertension. Benign prostatic hypertrophy.   DISCHARGE DIET: Low-sodium, low-fat, renal diet.   ACTIVITY: As tolerated.   FOLLOWUP: Walter Walter [name], Walter, in the next 1 to 2 weeks.   DISCHARGE MEDICATIONS: Allopurinol 100 mg daily, multivitamin daily, omeprazole 40 mg daily, vitamin C 500 mg daily, terazosin 2 mg at bedtime, acidophilus 1 tab daily, aspirin 81 mg daily, multivitamin daily, lactulose 15 mL b.i.d. as needed for constipation, lisinopril 20 mg daily, nifedipine 30 mg at bedtime, PhosLo 667 mg one cap t.i.d.,  psyllium 5 g mixed with juice or water daily, vitamin B12 1000 mcg sublingual 2 tabs daily, folic acid 0.4 mg two tabs daily, labetalol 100 mg  daily on Tuesday, Thursday, Saturday, Sunday, Levaquin 500 mg q.48 hours for 10 days. Prednisone taper starting at 50 mg down to 10 mg over the next 5 days.   CONSULTANTS DURING THE HOSPITAL COURSE: Dr. Mady Haagensen from nephrology.   PERTINENT STUDIES DONE DURING THE HOSPITAL COURSE: A CT scan of the head done without contrast on admission showing atrophy with chronic small vessel ischemic disease, no acute abnormalities.   A chest x-ray done on admission showing cardiomegaly. Differential consideration are CHF with  asymmetric pulmonary edema versus aspiration pneumonia.   HOSPITAL COURSE: This is an 81 year old male with medical problems as mentioned above, who presented to the hospital with altered mental status and confusion and a suspected syncopal episode.  1.  Altered mental status/confusion.  The most likely source of this was metabolic encephalopathy from pneumonia. The patient was empirically started on treatment with Levaquin and also given 1 dose of vancomycin given the fact that he was a dialysis patient. After getting IV antibiotics, the patient's mental status has now come back to baseline. He had a neurologic work-up including CT head which was normal. The patient remained afebrile and hemodynamically stable, and his mental status has remained stable in the past 24 hours, therefore is being discharged home.  2.  Suspected syncope. This was a possible diagnosis on admission. Apparently the patient had a syncopal episode at dialysis. He had no further syncopal episodes while in the hospital. He was observed on telemetry. Had no evidence of any arrhythmias even during dialysis while in the hospital. Maintained on telemetry. Had no further episodes of syncope. His CT head was negative.  3.  Pneumonia. The patient was treated with oral Levaquin while in the hospital and is being discharged on Levaquin for 5 more doses. His blood cultures remained negative. He had some mild bronchospasm secondary to his pneumonia; therefore, I did discharge him on a prednisone taper.  4.  End-stage renal disease on hemodialysis. The patient was maintained on his dialysis on Monday, Wednesday, Friday. He will continue that.  5.  Gout. He had no acute gout attack. He was continued on allopurinol.  6.  Hypertension. The patient was maintained on his labetalol, lisinopril and nifedipine. He will resume that.  7.  Benign prostatic hypertrophy. The patient was maintained on terazosin and he will resume that.   CODE STATUS: FULL CODE.   DISPOSITION: He is  being discharged to his independent living with home health physical therapy services. This was arranged for him prior to discharge.   TIME SPENT: 40 minutes.    ____________________________ Walter Walter [name], Walter [name] vjs:Walter D: 09/16/2013 15:32:28  ET T: 09/16/2013 22:14:53 ET JOB#: [id number]  cc: Walter Walter [name], Walter [name], <Dictator> Walter Walter [name], Walter Walter [name] ELECTRONICALLY SIGNED 10/06/2013 11:23

## 2015-01-27 ENCOUNTER — Encounter: Payer: Self-pay | Admitting: Emergency Medicine

## 2015-01-27 ENCOUNTER — Emergency Department: Payer: Medicare Other

## 2015-01-27 ENCOUNTER — Emergency Department
Admission: EM | Admit: 2015-01-27 | Discharge: 2015-01-27 | Disposition: A | Discharge status: Home / Self Care | Payer: Medicare Other | Attending: Emergency Medicine | Admitting: Emergency Medicine

## 2015-01-27 DIAGNOSIS — Z79899 Other long term (current) drug therapy: Secondary | ICD-10-CM | POA: Diagnosis not present

## 2015-01-27 DIAGNOSIS — Z7952 Long term (current) use of systemic steroids: Secondary | ICD-10-CM | POA: Diagnosis not present

## 2015-01-27 DIAGNOSIS — N186 End stage renal disease: Secondary | ICD-10-CM | POA: Insufficient documentation

## 2015-01-27 DIAGNOSIS — Y841 Kidney dialysis as the cause of abnormal reaction of the patient, or of later complication, without mention of misadventure at the time of the procedure: Secondary | ICD-10-CM | POA: Diagnosis not present

## 2015-01-27 DIAGNOSIS — L7622 Postprocedural hemorrhage and hematoma of skin and subcutaneous tissue following other procedure: Secondary | ICD-10-CM | POA: Insufficient documentation

## 2015-01-27 DIAGNOSIS — I120 Hypertensive chronic kidney disease with stage 5 chronic kidney disease or end stage renal disease: Secondary | ICD-10-CM | POA: Insufficient documentation

## 2015-01-27 DIAGNOSIS — R2232 Localized swelling, mass and lump, left upper limb: Secondary | ICD-10-CM | POA: Diagnosis present

## 2015-01-27 DIAGNOSIS — M7989 Other specified soft tissue disorders: Secondary | ICD-10-CM

## 2015-01-27 DIAGNOSIS — Z992 Dependence on renal dialysis: Secondary | ICD-10-CM | POA: Diagnosis not present

## 2015-01-27 HISTORY — DX: Disorder of kidney and ureter, unspecified: N28.9

## 2015-01-27 HISTORY — DX: Essential (primary) hypertension: I10

## 2015-01-27 NOTE — ED Provider Notes (Signed)
[facility] Emergency Department Provider Note   ____________________________________________  Time seen: 9 AM I have reviewed the triage vital signs and the triage nursing note.  HISTORY  Chief Complaint Arm Injury   Historian Patient and nursing home notes  HPI Trent Ane PaymentH Morabito is a 81 y.o. male who was sent in for evaluation of persistent swelling of the left arm about the site of his forearm graft. He apparently had a dialysis catheter dislodged and crit infiltration on Friday. Patient states he did get dialysis on Wednesday. The nursing home felt like the swelling was even worse today. He was sent to the ED for evaluation rather than to his dialysis today. Patient states his pain is mild. The swelling is moderate.    Past Medical History  Diagnosis Date  . Renal disorder   . Hypertension    end-stage renal disease with dialysis Left forearm graft  Patient Active Problem List   Diagnosis Date Noted  . ICH (intracerebral hemorrhage) 10/19/2014    No past surgical history on file.  Current Outpatient Rx  Name  Route  Sig  Dispense  Refill  . allopurinol (ZYLOPRIM) 100 MG tablet               . calcium acetate (PHOSLO) 667 MG capsule               . labetalol (NORMODYNE) 100 MG tablet               . levofloxacin (LEVAQUIN) 250 MG tablet               . lisinopril (PRINIVIL,ZESTRIL) 20 MG tablet               . omeprazole (PRILOSEC) 40 MG capsule               . predniSONE (DELTASONE) 10 MG tablet               . terazosin (HYTRIN) 2 MG capsule                 Allergies Review of patient's allergies indicates no known allergies.  History reviewed. No pertinent family history.  Social History History  Substance Use Topics  . Smoking status: Never Smoker   . Smokeless tobacco: Not on file  . Alcohol Use: No    Review of Systems  Constitutional: Negative for fever. Eyes: Negative for visual  changes. ENT: Negative for sore throat. Cardiovascular: Negative for chest pain. Respiratory: Negative for shortness of breath. Gastrointestinal: Negative for abdominal pain, vomiting and diarrhea. Genitourinary: Negative for dysuria. Musculoskeletal: Negative for back pain. Skin: Ecchymosis and swelling of the left upper Sherry Neurological: Negative for headaches, focal weakness or numbness.  ____________________________________________   PHYSICAL EXAM:  VITAL SIGNS: ED Triage Vitals  Enc Vitals Group     BP 01/27/15 0804 175/58 mmHg     Pulse Rate 01/27/15 0804 70     Resp 01/27/15 0804 16     Temp 01/27/15 0804 98.2 F (36.8 C)     Temp src --      SpO2 01/27/15 0804 96 %     Weight 01/27/15 0804 156 lb 11.9 oz (71.098 kg)     Height 01/27/15 0804 5\' 10"  (1.778 m)     Head Cir --      Peak Flow --      Pain Score 01/27/15 0806 5     Pain Loc --  Pain Edu? --      Excl. in GC? --      Constitutional: Alert and oriented. Well appearing and in no distress. Eyes: Conjunctivae are normal. PERRL. Normal extraocular movements. ENT   Head: Normocephalic and atraumatic.   Nose: No congestion/rhinnorhea.   Mouth/Throat: Mucous membranes are moist.   Neck: No stridor. Cardiovascular: Normal rate, regular rhythm.  No murmurs, rubs, or gallops. Respiratory: Normal respiratory effort without tachypnea nor retractions. Breath sounds are clear and equal bilaterally. No wheezes/rales/rhonchi. Gastrointestinal: Soft and nontender. No distention.  Genitourinary: Musculoskeletal: Left forearm with palpable graft with a distal thrill and bruit present. There is a significant amount of soft tissue pitting edema, dependently. Moderate ecchymosis about the distal left upper extremity where the graft is located. Neurologic:  Normal speech and language. No gross focal neurologic deficits are appreciated. Skin:  Skin is warm, dry and intact. No rash noted. Psychiatric: Mood  and affect are normal. Speech and behavior are normal. Patient exhibits appropriate insight and judgment.  ____________________________________________   EKG   ____________________________________________  LABS (pertinent positives/negatives)    ____________________________________________  RADIOLOGY Radiologist results reviewed  Ultrasound for DVT of the left upper extremity: Negative for DVT. Graft patent __________________________________________  PROCEDURES  Procedure(s) performed: None Critical Care performed: None  ____________________________________________   ED COURSE / ASSESSMENT AND PLAN  Pertinent labs & imaging results that were available during my care of the patient were reviewed by me and considered in my medical decision making (see chart for details).   No immediate concern about occlusion of the graft as there is a palpable thrill and clear distal perfusion.  An upper extremity ultrasound was ordered to assess for any proximal DVT, and there was none.  I discussed with both the on-call nephrologist and the on-call vascular surgeon, Dr. Wyn Quakerew, who suggested patient go ahead to dialysis and if unable to be accessed at that point he might need admission for alternative access. However he was dialyzed after the infiltration episode and his graft appears patent so I don't see any clear reason why he wouldn't be able to be dialyzed today.  Patient's dialysis center was consultation for patient to go directly there to get his dialysis today. He will be sent to dialysis. _ __________________________________________   FINAL CLINICAL IMPRESSION(S) / ED DIAGNOSES   Final diagnoses:  Left arm swelling      Governor Rooksebecca Nikiah Goin, MD 01/27/15 1328

## 2015-01-27 NOTE — ED Notes (Signed)
[facility] after dialysis cath was dislodged 1 week ago.  Has had dialysis -last on wed.  Area is swollen and bruised looking.  No redness.

## 2015-01-27 NOTE — ED Notes (Addendum)
Pt informed to return if any life threatening symptoms occur.  Report called to the [facility]. [facility]

## 2015-01-27 NOTE — Discharge Instructions (Signed)
You were evaluated for left arm swelling, which appears to be left over from infiltration during a prior dialysis. However the graft appears patent. Go to dialysis today immediately. Return to the emergency department for any cold fingers, numbness, weakness, new or worsening pain of the left arm.

## 2015-03-23 ENCOUNTER — Emergency Department
Admission: EM | Admit: 2015-03-23 | Discharge: 2015-03-24 | Disposition: A | Discharge status: Home / Self Care | Payer: Medicare Other | Attending: Emergency Medicine | Admitting: Emergency Medicine

## 2015-03-23 ENCOUNTER — Encounter: Payer: Self-pay | Admitting: Emergency Medicine

## 2015-03-23 DIAGNOSIS — M25142 Fistula, left hand: Secondary | ICD-10-CM | POA: Diagnosis not present

## 2015-03-23 DIAGNOSIS — R7881 Bacteremia: Secondary | ICD-10-CM | POA: Diagnosis not present

## 2015-03-23 DIAGNOSIS — N186 End stage renal disease: Secondary | ICD-10-CM | POA: Diagnosis not present

## 2015-03-23 DIAGNOSIS — I120 Hypertensive chronic kidney disease with stage 5 chronic kidney disease or end stage renal disease: Secondary | ICD-10-CM | POA: Diagnosis not present

## 2015-03-23 MED ORDER — VANCOMYCIN HCL 10 G IV SOLR: 20.0000 mg/kg | Freq: Once | INTRAVENOUS | Status: DC

## 2015-03-23 MED ORDER — VANCOMYCIN HCL 10 G IV SOLR
1500.0000 mg | Freq: Once | INTRAVENOUS | Status: AC
  Administered 2015-03-23: 1500 mg via INTRAVENOUS
  Filled 2015-03-23: qty 1500

## 2015-03-23 NOTE — ED Provider Notes (Signed)
[facility] Emergency Department Provider Note  ____________________________________________  Time seen: Seen upon arrival to the emergency department  I have reviewed the triage vital signs and the nursing notes.   HISTORY  Chief Complaint Blood Infection    HPI Walter [name] is a 81 y.o. male with a history of end-stage renal disease on dialysis who presents today with 2 positive blood cultures for gram-positive cocci. He has been having weakness for the past 2 weeks without any pain or focality. No fever at home. Was sent in by his nephrologist, Dr. Marianne SofiaBurgardt.  At this time the patient has no complaints. Denies any weakness or pain.   Past Medical History  Diagnosis Date  . Renal disorder   . Hypertension     Patient Active Problem List   Diagnosis Date Noted  . ICH (intracerebral hemorrhage) 10/19/2014    History reviewed. No pertinent past surgical history.  Current Outpatient Rx  Name  Route  Sig  Dispense  Refill  . allopurinol (ZYLOPRIM) 100 MG tablet               . calcium acetate (PHOSLO) 667 MG capsule               . labetalol (NORMODYNE) 100 MG tablet               . levofloxacin (LEVAQUIN) 250 MG tablet               . lisinopril (PRINIVIL,ZESTRIL) 20 MG tablet               . omeprazole (PRILOSEC) 40 MG capsule               . predniSONE (DELTASONE) 10 MG tablet               . terazosin (HYTRIN) 2 MG capsule                 Allergies Review of patient's allergies indicates no known allergies.  History reviewed. No pertinent family history.  Social History History  Substance Use Topics  . Smoking status: Never Smoker   . Smokeless tobacco: Not on file  . Alcohol Use: No    Review of Systems Constitutional: No fever/chills Eyes: No visual changes. ENT: No sore throat. Cardiovascular: Denies chest pain. Respiratory: Denies shortness of breath. Gastrointestinal: No abdominal pain.   No nausea, no vomiting.  No diarrhea.  No constipation. Genitourinary: Negative for dysuria. Musculoskeletal: Negative for back pain. Skin: Negative for rash. Neurological: Negative for headaches, focal weakness or numbness.  10-point ROS otherwise negative.  ____________________________________________   PHYSICAL EXAM:  VITAL SIGNS: ED Triage Vitals  Enc Vitals Group     BP 03/23/15 1828 173/58 mmHg     Pulse Rate 03/23/15 1828 70     Resp 03/23/15 1828 18     Temp 03/23/15 1828 99.1 F (37.3 C)     Temp src --      SpO2 03/23/15 1828 96 %     Weight 03/23/15 1828 152 lb (68.947 kg)     Height 03/23/15 1828 5\' 10"  (1.778 m)     Head Cir --      Peak Flow --      Pain Score 03/23/15 1829 0     Pain Loc --      Pain Edu? --      Excl. in GC? --     Constitutional: Alert and oriented. Well appearing and in  no acute distress. Eyes: Conjunctivae are normal. PERRL. EOMI. Head: Atraumatic. Nose: No congestion/rhinnorhea. Mouth/Throat: Mucous membranes are moist.  Oropharynx non-erythematous. Neck: No stridor.   Cardiovascular: Normal rate, regular rhythm. Grossly normal heart sounds.  Good peripheral circulation. Respiratory: Normal respiratory effort.  No retractions. Lungs CTAB. Gastrointestinal: Soft and nontender. No distention. No abdominal bruits. No CVA tenderness. Musculoskeletal: No lower extremity tenderness nor edema.  No joint effusions. Left upper extremity fistula with palpable thrill. Neurologic:  Normal speech and language. No gross focal neurologic deficits are appreciated. No gait instability. Skin:  Skin is warm, dry and intact. No rash noted. Psychiatric: Mood and affect are normal. Speech and behavior are normal.  ____________________________________________   LABS (all labs ordered are listed, but only abnormal results are displayed)  Labs Reviewed  CULTURE, BLOOD (ROUTINE X 2)  CULTURE, BLOOD (ROUTINE X 2)    ____________________________________________  EKG   ____________________________________________  RADIOLOGY   ____________________________________________   PROCEDURES    ____________________________________________   INITIAL IMPRESSION / ASSESSMENT AND PLAN / ED COURSE  Pertinent labs & imaging results that were available during my care of the patient were reviewed by me and considered in my medical decision making (see chart for details).  ----------------------------------------- 8:23 PM on 03/23/2015 -----------------------------------------  I initially had difficulty contacting Dr.Burgardt.  A cell phone number was given to me with the patient's medical facesheet. I called it twice and left a message the first time I called. There was no return phone call. The secretary then called Dr. Marianne Sofia office and she then called back. She said to load the patient with bank and draw cultures prior to these antibiotics. She says that she'll continue the medicines at dialysis. As long as the patient has stable vital signs and does not have any acute complaints the patient may be discharged after his vancomycin is completed here in the emergency department.   ----------------------------------------- 11:15 PM on 03/23/2015 -----------------------------------------  Patient says feeling improved with vancomycin. We'll discharge to home. To follow-up with dialysis tomorrow where he will continue his antibiotics. No fever, hypotension or tachycardia in the emergency department. No complaints of pain or weakness at this point. Feel that the blood cultures are likely positive due to line sepsis. ____________________________________________   FINAL CLINICAL IMPRESSION(S) / ED DIAGNOSES  Acute bacteremia. Initial visit.    Myrna Blazer, MD 03/23/15 [id number]

## 2015-03-23 NOTE — ED Notes (Signed)
PT to ER from [facility] via EMS.  Per EMS pt sent over by MD due to infection in his blood and needs antibiotics for the same.

## 2015-03-23 NOTE — ED Notes (Signed)
Report given to Misty StanleyLisa RN at [facility], which stated that they can not pick up the Pt.  Awaiting for EMS transport.

## 2015-03-23 NOTE — Discharge Instructions (Signed)
Bacteremia °Bacteremia occurs when bacteria get in your blood. Normal blood does not usually have bacteria. Bacteremia is one way infections can spread from one part of the body to another. °CAUSES  °· Causes may include anything that allows bacteria to get into the body. Examples are: °¨ Catheters. °¨ Intravenous (IV) access tubes. °¨ Cuts or scrapes of the skin. °· Temporary bacteremia may occur during dental procedures, while brushing your teeth, or during a bowel movement. This rarely causes any symptoms or medical problems. °· Bacteria may also get in the bloodstream as a complication of a bacterial infection elsewhere. This includes infected wounds and bacterial infections of the: °¨ Lungs (pneumonia). °¨ Kidneys (pyelonephritis). °¨ Intestines (enteritis, colitis). °¨ Organs in the abdomen (appendicitis, cholecystitis, diverticulitis). °SYMPTOMS  °The body is usually able to clear small numbers of bacteria out of the blood quickly. Brief bacteremia usually does not cause problems.  °· Problems can occur if the bacteria start to grow in number or spread to other parts of the body. If the bacteria start growing, you may develop: °¨ Chills. °¨ Fever. °¨ Nausea. °¨ Vomiting. °¨ Sweating. °¨ Lightheadedness and low blood pressure. °¨ Pain. °· If bacteria start to grow in the linings around the brain, it is called meningitis. This can cause severe headaches, many other problems, and even death. °· If bacteria start to grow in a joint, it causes arthritis with painful joints. If bacteria start to grow in a bone, it is called osteomyelitis. °· Bacteria from the blood can also cause sores (abscesses) in many organs, such as the muscle, liver, spleen, lungs, brain, and kidneys. °DIAGNOSIS  °· This condition is diagnosed by cultures of the blood. °· Cultures may also be taken from other parts of the body that are thought to be causing the bacteremia. A small piece of tissue, fluid, or other product of the body is  sampled. The sample is then put on a growth plate to see if any bacteria grows. °· Other lab tests may be done and the results may be abnormal. °TREATMENT  °Treatment requires a stay in the hospital. You will be given antibiotic medicine through an IV access tube. °PREVENTION  °People with an increased risk of developing bacteremia or complications may be given antibiotics before certain procedures. Examples are: °· A person with a heart murmur or artificial heart valve, before having his or her teeth cleaned. °· Before having a surgical or other invasive procedure. °· Before having a bowel procedure. °Document Released: 06/09/2006 Document Revised: 11/18/2011 Document Reviewed: 03/21/2011 °ExitCare® Patient Information ©2015 [employer]. This information is not intended to replace advice given to you by your health care provider. Make sure you discuss any questions you have with your health care provider. ° °

## 2015-03-24 NOTE — ED Notes (Signed)
Pt has completed discharge procedures. Awaiting [facility] for transport back to facility.

## 2015-03-28 LAB — CULTURE, BLOOD (ROUTINE X 2)
Culture: NO GROWTH
Culture: NO GROWTH

## 2015-10-06 ENCOUNTER — Emergency Department
Admission: EM | Admit: 2015-10-06 | Discharge: 2015-10-06 | Disposition: A | Discharge status: Home / Self Care | Payer: Medicare Other | Attending: Emergency Medicine | Admitting: Emergency Medicine

## 2015-10-06 ENCOUNTER — Emergency Department: Payer: Medicare Other

## 2015-10-06 ENCOUNTER — Encounter: Payer: Self-pay | Admitting: Emergency Medicine

## 2015-10-06 DIAGNOSIS — S0101XA Laceration without foreign body of scalp, initial encounter: Secondary | ICD-10-CM | POA: Insufficient documentation

## 2015-10-06 DIAGNOSIS — W19XXXA Unspecified fall, initial encounter: Secondary | ICD-10-CM

## 2015-10-06 DIAGNOSIS — I120 Hypertensive chronic kidney disease with stage 5 chronic kidney disease or end stage renal disease: Secondary | ICD-10-CM | POA: Diagnosis not present

## 2015-10-06 DIAGNOSIS — Z79899 Other long term (current) drug therapy: Secondary | ICD-10-CM | POA: Insufficient documentation

## 2015-10-06 DIAGNOSIS — Y9389 Activity, other specified: Secondary | ICD-10-CM | POA: Insufficient documentation

## 2015-10-06 DIAGNOSIS — Z7952 Long term (current) use of systemic steroids: Secondary | ICD-10-CM | POA: Diagnosis not present

## 2015-10-06 DIAGNOSIS — Y998 Other external cause status: Secondary | ICD-10-CM | POA: Insufficient documentation

## 2015-10-06 DIAGNOSIS — W01198A Fall on same level from slipping, tripping and stumbling with subsequent striking against other object, initial encounter: Secondary | ICD-10-CM | POA: Diagnosis not present

## 2015-10-06 DIAGNOSIS — N186 End stage renal disease: Secondary | ICD-10-CM | POA: Insufficient documentation

## 2015-10-06 DIAGNOSIS — S63501A Unspecified sprain of right wrist, initial encounter: Secondary | ICD-10-CM

## 2015-10-06 DIAGNOSIS — R55 Syncope and collapse: Secondary | ICD-10-CM | POA: Diagnosis present

## 2015-10-06 DIAGNOSIS — Y92128 Other place in nursing home as the place of occurrence of the external cause: Secondary | ICD-10-CM | POA: Diagnosis not present

## 2015-10-06 DIAGNOSIS — S6992XA Unspecified injury of left wrist, hand and finger(s), initial encounter: Secondary | ICD-10-CM | POA: Insufficient documentation

## 2015-10-06 MED ORDER — LIDOCAINE HCL (PF) 1 % IJ SOLN
INTRAMUSCULAR | Status: AC
  Administered 2015-10-06: 5 mL
  Filled 2015-10-06: qty 5

## 2015-10-06 NOTE — ED Notes (Signed)
Patient brought in by [facility] from dialysis center, patient has just finished dialysis treatment when he fell backwards. Patient has small laceration to the back of the head and is c/o right wrist pain

## 2015-10-06 NOTE — Discharge Instructions (Signed)
CT scan of head, cervical spine, or both negative for acute changes. X-ray of the right wrist was normal without fracture. A scalp laceration was irrigated and closed with staples, 4 of them. Staple removal in 7-8 days. This can be done at the facility. A staple remover has been supplied with the patient's family. The patient may return to the emergency department if this is not feasible. Return the emergency Department immediately if there are changes and communication or alertness, if the laceration becomes red and warm or has signs of infection, or if there are other urgent concerns.  Laceration Care, Adult A laceration is a cut that goes through all layers of the skin. The cut also goes into the tissue that is right under the skin. Some cuts heal on their own. Others need to be closed with stitches (sutures), staples, skin adhesive strips, or wound glue. Taking care of your cut lowers your risk of infection and helps your cut to heal better. HOW TO TAKE CARE OF YOUR CUT For stitches or staples:  Keep the wound clean and dry.  If you were given a bandage (dressing), you should change it at least one time per day or as told by your doctor. You should also change it if it gets wet or dirty.  Keep the wound completely dry for the first 24 hours or as told by your doctor. After that time, you may take a shower or a bath. However, make sure that the wound is not soaked in water until after the stitches or staples have been removed.  Clean the wound one time each day or as told by your doctor:  Wash the wound with soap and water.  Rinse the wound with water until all of the soap comes off.  Pat the wound dry with a clean towel. Do not rub the wound.  After you clean the wound, put a thin layer of antibiotic ointment on it as told by your doctor. This ointment:  Helps to prevent infection.  Keeps the bandage from sticking to the wound.  Have your stitches or staples removed as told by your  doctor. If your doctor used skin adhesive strips:   Keep the wound clean and dry.  If you were given a bandage, you should change it at least one time per day or as told by your doctor. You should also change it if it gets dirty or wet.  Do not get the skin adhesive strips wet. You can take a shower or a bath, but be careful to keep the wound dry.  If the wound gets wet, pat it dry with a clean towel. Do not rub the wound.  Skin adhesive strips fall off on their own. You can trim the strips as the wound heals. Do not remove any strips that are still stuck to the wound. They will fall off after a while. If your doctor used wound glue:  Try to keep your wound dry, but you may briefly wet it in the shower or bath. Do not soak the wound in water, such as by swimming.  After you take a shower or a bath, gently pat the wound dry with a clean towel. Do not rub the wound.  Do not do any activities that will make you really sweaty until the skin glue has fallen off on its own.  Do not apply liquid, cream, or ointment medicine to your wound while the skin glue is still on.  If you were given  a bandage, you should change it at least one time per day or as told by your doctor. You should also change it if it gets dirty or wet.  If a bandage is placed over the wound, do not let the tape for the bandage touch the skin glue.  Do not pick at the glue. The skin glue usually stays on for 5-10 days. Then, it falls off of the skin. General Instructions  To help prevent scarring, make sure to cover your wound with sunscreen whenever you are outside after stitches are removed, after adhesive strips are removed, or when wound glue stays in place and the wound is healed. Make sure to wear a sunscreen of at least 30 SPF.  Take over-the-counter and prescription medicines only as told by your doctor.  If you were given antibiotic medicine or ointment, take or apply it as told by your doctor. Do not stop using  the antibiotic even if your wound is getting better.  Do not scratch or pick at the wound.  Keep all follow-up visits as told by your doctor. This is important.  Check your wound every day for signs of infection. Watch for:  Redness, swelling, or pain.  Fluid, blood, or pus.  Raise (elevate) the injured area above the level of your heart while you are sitting or lying down, if possible. GET HELP IF:  You got a tetanus shot and you have any of these problems at the injection site:  Swelling.  Very bad pain.  Redness.  Bleeding.  You have a fever.  A wound that was closed breaks open.  You notice a bad smell coming from your wound or your bandage.  You notice something coming out of the wound, such as wood or glass.  Medicine does not help your pain.  You have more redness, swelling, or pain at the site of your wound.  You have fluid, blood, or pus coming from your wound.  You notice a change in the color of your skin near your wound.  You need to change the bandage often because fluid, blood, or pus is coming from the wound.  You start to have a new rash.  You start to have numbness around the wound. GET HELP RIGHT AWAY IF:  You have very bad swelling around the wound.  Your pain suddenly gets worse and is very bad.  You notice painful lumps near the wound or on skin that is anywhere on your body.  You have a red streak going away from your wound.  The wound is on your hand or foot and you cannot move a finger or toe like you usually can.  The wound is on your hand or foot and you notice that your fingers or toes look pale or bluish.   This information is not intended to replace advice given to you by your health care provider. Make sure you discuss any questions you have with your health care provider.   Document Released: 02/12/2008 Document Revised: 01/10/2015 Document Reviewed: 08/22/2014 Elsevier Interactive Patient Education 2016 [employer].  Syncope Syncope means a person passes out (faints). The person usually wakes up in less than 5 minutes. It is important to seek medical care for syncope. HOME CARE  Have someone stay with you until you feel normal.  Do not drive, use machines, or play sports until your doctor says it is okay.  Keep all doctor visits as told.  Lie down when you feel like you might  pass out. Take deep breaths. Wait until you feel normal before standing up.  Drink enough fluids to keep your pee (urine) clear or pale yellow.  If you take blood pressure or heart medicine, get up slowly. Take several minutes to sit and then stand. GET HELP RIGHT AWAY IF:   You have a severe headache.  You have pain in the chest, belly (abdomen), or back.  You are bleeding from the mouth or butt (rectum).  You have black or tarry poop (stool).  You have an irregular or very fast heartbeat.  You have pain with breathing.  You keep passing out, or you have shaking (seizures) when you pass out.  You pass out when sitting or lying down.  You feel confused.  You have trouble walking.  You have severe weakness.  You have vision problems. If you fainted, call for help (911 in [place] Do not drive yourself to the hospital.   This information is not intended to replace advice given to you by your health care provider. Make sure you discuss any questions you have with your health care provider.   Document Released: 02/12/2008 Document Revised: 01/10/2015 Document Reviewed: 10/25/2011 Elsevier Interactive Patient Education [employer]

## 2015-10-06 NOTE — ED Provider Notes (Signed)
[facility] Emergency Department Provider Note  ____________________________________________  Time seen: 1630  I have reviewed the triage vital signs and the nursing notes.  History by:  Patient  HISTORY  Chief Complaint Fall  syncope    HPI Walter [name] is a 82 y.o. male who just completed his dialysis. He was about to leave the dialysis center when he had a single episode and fell backwards. He had the ground rather hard with the back of his head. He has a laceration to the back of his head. At this time, he is awake alert communicative and reports no acute distress. He does seem to have some discomfort with his right wrist. He does report having some discomfort in the fingers of both hands.     Past Medical History  Diagnosis Date  . Renal disorder   . Hypertension     Patient Active Problem List   Diagnosis Date Noted  . ICH (intracerebral hemorrhage) (HCC) 10/19/2014    History reviewed. No pertinent past surgical history.  Current Outpatient Rx  Name  Route  Sig  Dispense  Refill  . allopurinol (ZYLOPRIM) 100 MG tablet               . calcium acetate (PHOSLO) 667 MG capsule               . labetalol (NORMODYNE) 100 MG tablet               . levofloxacin (LEVAQUIN) 250 MG tablet               . lisinopril (PRINIVIL,ZESTRIL) 20 MG tablet               . omeprazole (PRILOSEC) 40 MG capsule               . predniSONE (DELTASONE) 10 MG tablet               . terazosin (HYTRIN) 2 MG capsule                 Allergies Review of patient's allergies indicates no known allergies.  No family history on file.  Social History Social History  Substance Use Topics  . Smoking status: Never Smoker   . Smokeless tobacco: None  . Alcohol Use: No    Review of Systems  Constitutional: Negative for fever/chills. ENT: Notable for contusion to posterior scalp with laceration. Cardiovascular: Negative for chest  pain. Respiratory: Negative for cough. Gastrointestinal: Negative for abdominal pain, vomiting and diarrhea. Genitourinary: History of end-stage renal disease Musculoskeletal: Pain in the right wrist.. Skin: Negative for rash. Neurological: Negative for headache or focal weakness   10-point ROS otherwise negative.  ____________________________________________   PHYSICAL EXAM:  VITAL SIGNS: ED Triage Vitals  Enc Vitals Group     BP 10/06/15 1635 168/69 mmHg     Pulse Rate 10/06/15 1635 79     Resp --      Temp 10/06/15 1635 98.1 F (36.7 C)     Temp src --      SpO2 10/06/15 1635 94 %     Weight 10/06/15 1635 157 lb (71.215 kg)     Height 10/06/15 1635  (1.854 m)     Head Cir --    [facility] 1637 2     Pain Loc --      Pain Edu? --  Excl. in GC? --     Constitutional: Alert communicative. Well appearing and in no distress. ENT   Head: Normocephalic, but with laceration to the posterior area, 3 cm.   Nose: No congestion/rhinnorhea.       Mouth: No erythema, no swelling   Cardiovascular: Normal rate, regular rhythm, no murmur noted Respiratory:  Normal respiratory effort, no tachypnea.    Breath sounds are clear and equal bilaterally.  Gastrointestinal: Soft, no distention. Nontender Back: No muscle spasm, no tenderness, no CVA tenderness. Musculoskeletal: Minimal discomfort to the right wrist with some mild swelling. The remainder of his extremities appear normal without deformity. Neurologic:  Communicative. Normal appearing spontaneous movement in all 4 extremities. No gross focal neurologic deficits are appreciated.  Skin:  Laceration in the posterior area of his scalp. Psychiatric: Mood and affect are normal. Speech and behavior are normal.  ____________________________________________     ____________________________________________    RADIOLOGY  CT head and cervical spine:  IMPRESSION: No acute intracranial  abnormality.  Atrophy, chronic microvascular disease.  Severe degenerative changes throughout the cervical spine. No acute bony abnormality.  Right wrist: FINDINGS: Degenerative changes in the right wrist. No acute bony abnormality. Specifically, no fracture, subluxation, or dislocation. Soft tissues are intact.  IMPRESSION: No acute bony abnormality.  ____________________________________________  Procedure:  Laceration closure LACERATION REPAIR Performed by: Darien Ramus Authorized by: Darien Ramus Consent: Verbal consent obtained. Risks and benefits: risks, benefits and alternatives were discussed Consent given by: patient Patient identity confirmed: provided demographic data Prepped and Draped in normal sterile fashion Wound explored  Laceration Location: Posterior scalp  Laceration Length: 3 cm  No Foreign Bodies seen or palpated  Anesthesia: local infiltration  Local anesthetic: lidocaine 1 % without epinephrine  Anesthetic total: 3 ml  Irrigation method: syringe Amount of cleaning: standard  Skin closure: Staples   Number of a pulse: 4   Technique: Simple staples   Patient tolerance: Patient tolerated the procedure well with no immediate complications.    ____________________________________________   INITIAL IMPRESSION / ASSESSMENT AND PLAN / ED COURSE  Pertinent labs & imaging results that were available during my care of the patient were reviewed by me and considered in my medical decision making (see chart for details).  82 year old male who had a syncopal seventh fall following dialysis. He feels well and is alert here. He does have a laceration which we will close. We will perform a CT scan of the head and cervical spine. He also has some mild swelling of the right wrist. Wrist x-ray pending.  ----------------------------------------- 6:32 PM on 10/06/2015 -----------------------------------------  CT of head and cervical spine  does not show any acute injuries. X-ray of the right wrist does not show any fracture or bony abnormality.  On reexamination, the patient is alert, no acute distress, and no weakness. Closure of the laceration is still pending. Will also and the patient and assess for instability.  ----------------------------------------- 8:04 PM on 10/06/2015 -----------------------------------------  Patient's healthcare for return he is here now. She seems comfortable with the patient. He is at baseline. We have closed the laceration and we will discharge him home. We have provided information on staple removal.   ____________________________________________   FINAL CLINICAL IMPRESSION(S) / ED DIAGNOSES  Final diagnoses:  Syncope and collapse  Fall, initial encounter  Scalp laceration, initial encounter  Wrist sprain, right, initial encounter      Darien Ramus, MD 10/06/15 2009

## 2015-10-06 NOTE — ED Notes (Signed)
Crystal from the [employer] called to on patient and get an update. Crystal informed that we are waiting on MD to review patients test results and that patient is currently stable.

## 2015-10-17 ENCOUNTER — Encounter: Payer: Self-pay | Admitting: Emergency Medicine

## 2015-10-17 ENCOUNTER — Emergency Department: Payer: Medicare Other

## 2015-10-17 ENCOUNTER — Inpatient Hospital Stay
Admission: EM | Admit: 2015-10-17 | Discharge: 2015-10-23 | DRG: 480 | Disposition: A | Discharge status: Skilled Nursing Facility | Payer: Medicare Other | Attending: Internal Medicine | Admitting: Internal Medicine

## 2015-10-17 DIAGNOSIS — E871 Hypo-osmolality and hyponatremia: Secondary | ICD-10-CM | POA: Diagnosis present

## 2015-10-17 DIAGNOSIS — Z7952 Long term (current) use of systemic steroids: Secondary | ICD-10-CM | POA: Diagnosis not present

## 2015-10-17 DIAGNOSIS — Z79899 Other long term (current) drug therapy: Secondary | ICD-10-CM | POA: Diagnosis not present

## 2015-10-17 DIAGNOSIS — W19XXXA Unspecified fall, initial encounter: Secondary | ICD-10-CM | POA: Diagnosis present

## 2015-10-17 DIAGNOSIS — Z419 Encounter for procedure for purposes other than remedying health state, unspecified: Secondary | ICD-10-CM

## 2015-10-17 DIAGNOSIS — I120 Hypertensive chronic kidney disease with stage 5 chronic kidney disease or end stage renal disease: Secondary | ICD-10-CM | POA: Diagnosis present

## 2015-10-17 DIAGNOSIS — N2581 Secondary hyperparathyroidism of renal origin: Secondary | ICD-10-CM | POA: Diagnosis present

## 2015-10-17 DIAGNOSIS — Z66 Do not resuscitate: Secondary | ICD-10-CM | POA: Diagnosis present

## 2015-10-17 DIAGNOSIS — S72142A Displaced intertrochanteric fracture of left femur, initial encounter for closed fracture: Secondary | ICD-10-CM | POA: Diagnosis present

## 2015-10-17 DIAGNOSIS — Z992 Dependence on renal dialysis: Secondary | ICD-10-CM | POA: Diagnosis not present

## 2015-10-17 DIAGNOSIS — D631 Anemia in chronic kidney disease: Secondary | ICD-10-CM | POA: Diagnosis present

## 2015-10-17 DIAGNOSIS — W1830XA Fall on same level, unspecified, initial encounter: Secondary | ICD-10-CM | POA: Diagnosis present

## 2015-10-17 DIAGNOSIS — Z8249 Family history of ischemic heart disease and other diseases of the circulatory system: Secondary | ICD-10-CM | POA: Diagnosis not present

## 2015-10-17 DIAGNOSIS — S72002A Fracture of unspecified part of neck of left femur, initial encounter for closed fracture: Secondary | ICD-10-CM | POA: Diagnosis present

## 2015-10-17 DIAGNOSIS — K5900 Constipation, unspecified: Secondary | ICD-10-CM | POA: Diagnosis present

## 2015-10-17 DIAGNOSIS — N186 End stage renal disease: Secondary | ICD-10-CM | POA: Diagnosis present

## 2015-10-17 DIAGNOSIS — R739 Hyperglycemia, unspecified: Secondary | ICD-10-CM | POA: Diagnosis present

## 2015-10-17 DIAGNOSIS — S72009A Fracture of unspecified part of neck of unspecified femur, initial encounter for closed fracture: Secondary | ICD-10-CM | POA: Diagnosis present

## 2015-10-17 DIAGNOSIS — Y92129 Unspecified place in nursing home as the place of occurrence of the external cause: Secondary | ICD-10-CM

## 2015-10-17 HISTORY — DX: Dependence on renal dialysis: N18.6

## 2015-10-17 HISTORY — DX: End stage renal disease: Z99.2

## 2015-10-17 LAB — CBC WITH DIFFERENTIAL/PLATELET
BASOS ABS: 0 10*3/uL (ref 0–0.1)
BASOS PCT: 1 %
EOS PCT: 3 %
Eosinophils Absolute: 0.1 10*3/uL (ref 0–0.7)
HEMATOCRIT: 30.9 % — AB (ref 40.0–52.0)
Hemoglobin: 10 g/dL — ABNORMAL LOW (ref 13.0–18.0)
Lymphocytes Relative: 16 %
Lymphs Abs: 0.7 10*3/uL — ABNORMAL LOW (ref 1.0–3.6)
MCH: 30.1 pg (ref 26.0–34.0)
MCHC: 32.3 g/dL (ref 32.0–36.0)
MCV: 93.2 fL (ref 80.0–100.0)
MONO ABS: 0.3 10*3/uL (ref 0.2–1.0)
MONOS PCT: 6 %
Neutro Abs: 3.5 10*3/uL (ref 1.4–6.5)
Neutrophils Relative %: 74 %
Platelets: 138 10*3/uL — ABNORMAL LOW (ref 150–440)
RBC: 3.31 MIL/uL — ABNORMAL LOW (ref 4.40–5.90)
RDW: 16.8 % — ABNORMAL HIGH (ref 11.5–14.5)
WBC: 4.7 10*3/uL (ref 3.8–10.6)

## 2015-10-17 LAB — BASIC METABOLIC PANEL
ANION GAP: 7 (ref 5–15)
BUN: 53 mg/dL — ABNORMAL HIGH (ref 6–20)
CHLORIDE: 96 mmol/L — AB (ref 101–111)
CO2: 33 mmol/L — AB (ref 22–32)
CREATININE: 4.73 mg/dL — AB (ref 0.61–1.24)
Calcium: 8.3 mg/dL — ABNORMAL LOW (ref 8.9–10.3)
GFR calc non Af Amer: 10 mL/min — ABNORMAL LOW (ref >60)
GFR, EST AFRICAN AMERICAN: 11 mL/min — AB (ref >60)
Glucose, Bld: 110 mg/dL — ABNORMAL HIGH (ref 65–99)
Potassium: 3.8 mmol/L (ref 3.5–5.1)
SODIUM: 136 mmol/L (ref 135–145)

## 2015-10-17 LAB — PROTIME-INR
INR: 1.16
PROTHROMBIN TIME: 15 s (ref 11.4–15.0)

## 2015-10-17 MED ORDER — SODIUM CHLORIDE 0.9 % IV SOLN
Freq: Once | INTRAVENOUS | Status: AC
  Administered 2015-10-18: 05:00:00 via INTRAVENOUS

## 2015-10-17 MED ORDER — METHOCARBAMOL 1000 MG/10ML IJ SOLN: 500.0000 mg | Freq: Four times a day (QID) | INTRAVENOUS | Status: DC | PRN

## 2015-10-17 MED ORDER — MORPHINE SULFATE (PF) 2 MG/ML IV SOLN: 1.0000 mg | INTRAVENOUS | Status: DC | PRN

## 2015-10-17 MED ORDER — HYDROCODONE-ACETAMINOPHEN 5-325 MG PO TABS: 1.0000 | ORAL_TABLET | Freq: Four times a day (QID) | ORAL | Status: DC | PRN

## 2015-10-17 MED ORDER — CLINDAMYCIN PHOSPHATE 600 MG/50ML IV SOLN
600.0000 mg | Freq: Once | INTRAVENOUS | Status: AC
  Administered 2015-10-18: 600 mg via INTRAVENOUS
  Filled 2015-10-17: qty 50

## 2015-10-17 MED ORDER — ONDANSETRON HCL 4 MG/2ML IJ SOLN
4.0000 mg | Freq: Once | INTRAMUSCULAR | Status: AC
  Administered 2015-10-17: 4 mg via INTRAVENOUS
  Filled 2015-10-17: qty 2

## 2015-10-17 MED ORDER — METHOCARBAMOL 500 MG PO TABS
500.0000 mg | ORAL_TABLET | Freq: Four times a day (QID) | ORAL | Status: DC | PRN
  Filled 2015-10-17: qty 1

## 2015-10-17 MED ORDER — SENNA 8.6 MG PO TABS
1.0000 | ORAL_TABLET | Freq: Two times a day (BID) | ORAL | Status: DC
  Administered 2015-10-18: 8.6 mg via ORAL
  Filled 2015-10-17: qty 1

## 2015-10-17 MED ORDER — MORPHINE SULFATE (PF) 4 MG/ML IV SOLN
4.0000 mg | Freq: Once | INTRAVENOUS | Status: AC
  Administered 2015-10-17: 4 mg via INTRAVENOUS
  Filled 2015-10-17: qty 1

## 2015-10-17 MED ORDER — ZOLPIDEM TARTRATE 5 MG PO TABS
5.0000 mg | ORAL_TABLET | Freq: Every evening | ORAL | Status: DC | PRN
Start: 2015-10-17 — End: 2015-10-19

## 2015-10-17 NOTE — ED Notes (Signed)
Patient transported to X-ray via stretcher 

## 2015-10-17 NOTE — ED Notes (Signed)
Pt presents to ED via [facility] from [facility] c/o left hip pain after falling this evening. Pt reports was using walker and leaned to far over his walker and slipped and fell. Abrasion noted to bridge of nose, pt reports "I hit it on my walker." Pt reports pain to left hip with movement. (+) bilateral pedal pulses and capillary refill. Skin warm and dry. Pt denies LOC, chest pain, dizziness or shortness of breath. Pt alert and oriented x 4, no increased work in breathing noted.

## 2015-10-17 NOTE — ED Provider Notes (Signed)
[facility] Emergency Department Provider Note   ____________________________________________  Time seen: ~2115  I have reviewed the triage vital signs and the nursing notes.   HISTORY  Chief Complaint Fall   History limited by: Not Limited   HPI Walter [name] is a 82 y.o. male who presents to the emergency department today because of concerns for fall and left hip pain. The patient states it was a mechanical fall. He states he was using his walker and dropped his sweater. He states that he bent over to pick up his sweater when he fell. He states he started having immediate onset of left hip pain. This pain has been constant. It is severe when he moves. He denies any head trauma or loss of consciousness. Denies any wrist or hand trauma. He denies any chest pain or shortness of breath the time of fall.    Past Medical History  Diagnosis Date  . Renal disorder   . Hypertension     Patient Active Problem List   Diagnosis Date Noted  . ICH (intracerebral hemorrhage) (HCC) 10/19/2014    History reviewed. No pertinent past surgical history.  Current Outpatient Rx  Name  Route  Sig  Dispense  Refill  . allopurinol (ZYLOPRIM) 100 MG tablet               . calcium acetate (PHOSLO) 667 MG capsule               . labetalol (NORMODYNE) 100 MG tablet               . levofloxacin (LEVAQUIN) 250 MG tablet               . lisinopril (PRINIVIL,ZESTRIL) 20 MG tablet               . omeprazole (PRILOSEC) 40 MG capsule               . predniSONE (DELTASONE) 10 MG tablet               . terazosin (HYTRIN) 2 MG capsule                 Allergies Review of patient's allergies indicates no known allergies.  No family history on file.  Social History Social History  Substance Use Topics  . Smoking status: Never Smoker   . Smokeless tobacco: None  . Alcohol Use: No    Review of Systems  Constitutional: Negative for  fever. Cardiovascular: Negative for chest pain. Respiratory: Negative for shortness of breath. Gastrointestinal: Negative for abdominal pain, vomiting and diarrhea. Neurological: Negative for headaches, focal weakness or numbness.  10-point ROS otherwise negative.  ____________________________________________   PHYSICAL EXAM:  VITAL SIGNS: ED Triage Vitals  Enc Vitals Group     BP 10/17/15 2019 174/48 mmHg     Pulse Rate 10/17/15 2019 58     Resp 10/17/15 2019 18     Temp 10/17/15 2019 97.4 F (36.3 C)     Temp Source 10/17/15 2019 Oral     SpO2 10/17/15 2013 92 %     Weight 10/17/15 2019 165 lb (74.844 kg)     Height 10/17/15 2019  (1.778 m)     Head Cir --      Peak Flow --      Pain Score 10/17/15 2020 4   Constitutional: Alert and oriented. Well appearing and in no distress. Eyes: Conjunctivae are normal. PERRL. Normal extraocular movements. ENT  Head: Normocephalic and atraumatic.   Nose: No congestion/rhinnorhea.   Mouth/Throat: Mucous membranes are moist.   Neck: No stridor. No midline tenderness. Hematological/Lymphatic/Immunilogical: No cervical lymphadenopathy. Cardiovascular: Normal rate, regular rhythm.  No murmurs, rubs, or gallops. Respiratory: Normal respiratory effort without tachypnea nor retractions. Breath sounds are clear and equal bilaterally. No wheezes/rales/rhonchi. Gastrointestinal: Soft and nontender. No distention.  Genitourinary: Deferred Musculoskeletal: Left hip with tenderness to manipulation and palpation. Neurovascularly intact distally. Neurologic:  Normal speech and language. No gross focal neurologic deficits are appreciated.  Skin:  Skin is warm, dry and intact. No rash noted. Psychiatric: Mood and affect are normal. Speech and behavior are normal. Patient exhibits appropriate insight and judgment.  ____________________________________________    LABS (pertinent positives/negatives)  WBC 4.7 Hgb 10 Plt  138 PT 15.0 INR 1.16 Na 136 Cr 4.73  ____________________________________________    RADIOLOGY  Left hip IMPRESSION: Acute left intertrochanteric fracture.  I, Retina Bernardy, personally viewed and evaluated these images (hip plain radiographs) as part of my medical decision making. ____________________________________________   PROCEDURES  Procedure(s) performed: None  Critical Care performed: No  ____________________________________________   INITIAL IMPRESSION / ASSESSMENT AND PLAN / ED COURSE  Pertinent labs & imaging results that were available during my care of the patient were reviewed by me and considered in my medical decision making (see chart for details).  Patient presented to the emergency department today after a mechanical fall and left hip pain. X-rays are consistent with left intertrochanteric fracture. Will plan on admission to the hospitalist service. I will contact orthopedics.  ____________________________________________   FINAL CLINICAL IMPRESSION(S) / ED DIAGNOSES  Final diagnoses:  Hip fracture, left, closed, initial encounter (HCC)     Phineas Semen, MD 10/18/15 [phone]

## 2015-10-18 ENCOUNTER — Encounter: Payer: Self-pay | Admitting: Specialist

## 2015-10-18 ENCOUNTER — Other Ambulatory Visit: Payer: Self-pay | Admitting: Specialist

## 2015-10-18 LAB — CBC
HCT: 26.8 % — ABNORMAL LOW (ref 40.0–52.0)
Hemoglobin: 8.7 g/dL — ABNORMAL LOW (ref 13.0–18.0)
MCH: 30.2 pg (ref 26.0–34.0)
MCHC: 32.6 g/dL (ref 32.0–36.0)
MCV: 92.7 fL (ref 80.0–100.0)
PLATELETS: 140 10*3/uL — AB (ref 150–440)
RBC: 2.89 MIL/uL — ABNORMAL LOW (ref 4.40–5.90)
RDW: 16.9 % — ABNORMAL HIGH (ref 11.5–14.5)
WBC: 6.8 10*3/uL (ref 3.8–10.6)

## 2015-10-18 LAB — BASIC METABOLIC PANEL
Anion gap: 9 (ref 5–15)
BUN: 57 mg/dL — AB (ref 6–20)
CALCIUM: 7.8 mg/dL — AB (ref 8.9–10.3)
CO2: 29 mmol/L (ref 22–32)
Chloride: 96 mmol/L — ABNORMAL LOW (ref 101–111)
Creatinine, Ser: 4.92 mg/dL — ABNORMAL HIGH (ref 0.61–1.24)
GFR calc Af Amer: 11 mL/min — ABNORMAL LOW (ref >60)
GFR, EST NON AFRICAN AMERICAN: 9 mL/min — AB (ref >60)
GLUCOSE: 113 mg/dL — AB (ref 65–99)
POTASSIUM: 3.5 mmol/L (ref 3.5–5.1)
Sodium: 134 mmol/L — ABNORMAL LOW (ref 135–145)

## 2015-10-18 LAB — PROTIME-INR
INR: 1.29
Prothrombin Time: 16.2 seconds — ABNORMAL HIGH (ref 11.4–15.0)

## 2015-10-18 LAB — ALBUMIN: Albumin: 2.9 g/dL — ABNORMAL LOW (ref 3.5–5.0)

## 2015-10-18 LAB — MRSA PCR SCREENING: MRSA by PCR: NEGATIVE

## 2015-10-18 LAB — HEMOGLOBIN A1C: HEMOGLOBIN A1C: 4.8 % (ref 4.0–6.0)

## 2015-10-18 LAB — APTT: APTT: 34 s (ref 24–36)

## 2015-10-18 LAB — PREPARE RBC (CROSSMATCH)

## 2015-10-18 LAB — ABO/RH: ABO/RH(D): O NEG

## 2015-10-18 MED ORDER — PANTOPRAZOLE SODIUM 40 MG PO TBEC
40.0000 mg | DELAYED_RELEASE_TABLET | Freq: Every day | ORAL | Status: DC
  Administered 2015-10-18: 40 mg via ORAL
  Filled 2015-10-18: qty 1

## 2015-10-18 MED ORDER — RISAQUAD PO CAPS: 1.0000 | ORAL_CAPSULE | Freq: Every day | ORAL | Status: DC

## 2015-10-18 MED ORDER — NIFEDIPINE 10 MG PO CAPS
40.0000 mg | ORAL_CAPSULE | Freq: Every day | ORAL | Status: DC
  Filled 2015-10-18: qty 4

## 2015-10-18 MED ORDER — MINOXIDIL 2.5 MG PO TABS
2.5000 mg | ORAL_TABLET | Freq: Every day | ORAL | Status: DC
Start: 2015-10-18 — End: 2015-10-19
  Filled 2015-10-18 (×2): qty 1

## 2015-10-18 MED ORDER — CALCIUM CARBONATE ANTACID 500 MG PO CHEW
1.0000 | CHEWABLE_TABLET | Freq: Three times a day (TID) | ORAL | Status: DC
  Administered 2015-10-18 (×2): 200 mg via ORAL
  Filled 2015-10-18 (×2): qty 1

## 2015-10-18 MED ORDER — FOLIC ACID 1 MG PO TABS: 1.0000 mg | ORAL_TABLET | Freq: Every day | ORAL | Status: DC

## 2015-10-18 MED ORDER — ONDANSETRON HCL 4 MG PO TABS: 4.0000 mg | ORAL_TABLET | Freq: Four times a day (QID) | ORAL | Status: DC | PRN

## 2015-10-18 MED ORDER — ADULT MULTIVITAMIN W/MINERALS CH: 1.0000 | ORAL_TABLET | Freq: Every day | ORAL | Status: DC

## 2015-10-18 MED ORDER — SENNOSIDES-DOCUSATE SODIUM 8.6-50 MG PO TABS
1.0000 | ORAL_TABLET | Freq: Every evening | ORAL | Status: DC | PRN
  Filled 2015-10-18: qty 1

## 2015-10-18 MED ORDER — CYANOCOBALAMIN 500 MCG PO TABS: 500.0000 ug | ORAL_TABLET | Freq: Every day | ORAL | Status: DC

## 2015-10-18 MED ORDER — ACETAMINOPHEN 650 MG RE SUPP: 650.0000 mg | Freq: Four times a day (QID) | RECTAL | Status: DC | PRN

## 2015-10-18 MED ORDER — TERAZOSIN HCL 2 MG PO CAPS
2.0000 mg | ORAL_CAPSULE | Freq: Every day | ORAL | Status: DC
  Filled 2015-10-18 (×3): qty 1

## 2015-10-18 MED ORDER — METOPROLOL TARTRATE 50 MG PO TABS
50.0000 mg | ORAL_TABLET | Freq: Two times a day (BID) | ORAL | Status: DC
  Administered 2015-10-18 – 2015-10-19 (×2): 50 mg via ORAL
  Filled 2015-10-18 (×4): qty 1

## 2015-10-18 MED ORDER — ACETAMINOPHEN 325 MG PO TABS
650.0000 mg | ORAL_TABLET | Freq: Four times a day (QID) | ORAL | Status: DC | PRN
  Administered 2015-10-18: 650 mg via ORAL
  Filled 2015-10-18: qty 2

## 2015-10-18 MED ORDER — FOLIC ACID 800 MCG PO TABS: 800.0000 ug | ORAL_TABLET | Freq: Every day | ORAL | Status: DC

## 2015-10-18 MED ORDER — CALCIUM ACETATE (PHOS BINDER) 667 MG PO CAPS
667.0000 mg | ORAL_CAPSULE | Freq: Three times a day (TID) | ORAL | Status: DC
  Administered 2015-10-18: 667 mg via ORAL
  Filled 2015-10-18: qty 1

## 2015-10-18 MED ORDER — LISINOPRIL 20 MG PO TABS
40.0000 mg | ORAL_TABLET | Freq: Every day | ORAL | Status: DC
Start: 2015-10-18 — End: 2015-10-19
  Filled 2015-10-18: qty 2

## 2015-10-18 MED ORDER — MORPHINE SULFATE (PF) 2 MG/ML IV SOLN
2.0000 mg | INTRAVENOUS | Status: DC | PRN
  Administered 2015-10-18 (×3): 2 mg via INTRAVENOUS
  Filled 2015-10-18 (×3): qty 1

## 2015-10-18 MED ORDER — HYDROCODONE-ACETAMINOPHEN 5-325 MG PO TABS
1.0000 | ORAL_TABLET | ORAL | Status: DC | PRN
  Administered 2015-10-18 – 2015-10-19 (×2): 1 via ORAL
  Filled 2015-10-18 (×2): qty 1

## 2015-10-18 MED ORDER — ONDANSETRON HCL 4 MG/2ML IJ SOLN: 4.0000 mg | Freq: Four times a day (QID) | INTRAMUSCULAR | Status: DC | PRN

## 2015-10-18 MED ORDER — SODIUM CHLORIDE 0.9 % IV SOLN
INTRAVENOUS | Status: DC
  Administered 2015-10-19: 10:00:00 via INTRAVENOUS

## 2015-10-18 MED ORDER — PRO-STAT SUGAR FREE PO LIQD
30.0000 mL | Freq: Three times a day (TID) | ORAL | Status: DC
  Administered 2015-10-18: 30 mL via ORAL

## 2015-10-18 NOTE — Progress Notes (Signed)
Orderly here to take patient to dialysis. Consents on chart.

## 2015-10-18 NOTE — Consult Note (Signed)
ORTHOPAEDIC CONSULTATION  REQUESTING PHYSICIAN: Katharina Caper, MD  Chief Complaint: Left hip pain  HPI: Walter [name] is a 82 y.o. male who complains of  left hip pain following a fall at assisted living last night.  Patient was seen in the emergency room where exam and x-rays revealed a comminuted intertrochanteric fracture of the left hip.  He was admitted for surgery and medical clearance.  The patient has end-stage renal disease and is on dialysis 3 times a week.  He is scheduled for dialysis today.  The nephrology service will be seen him and determining whether to proceed with dialysis.  Her surgery today.  The risks and benefits of surgery discussed with the patient and his power of attorney.  They do wish to proceed with surgery as soon as possible.  Past Medical History  Diagnosis Date  . Renal disorder   . Hypertension   . ESRD on dialysis [name])    Past Surgical History  Procedure Laterality Date  . Av fistula placement    . Hip surgery     Social History   Social History  . Marital Status: Single    Spouse Name: N/A  . Number of Children: N/A  . Years of Education: N/A   Occupational History  . retired    Social History Main Topics  . Smoking status: Never Smoker   . Smokeless tobacco: None  . Alcohol Use: No  . Drug Use: No  . Sexual Activity: Not Asked   Other Topics Concern  . None   Social History Narrative   Resident of assisted living facility.   Family History  Problem Relation Age of Onset  . Heart disease Mother     deceased  . Heart disease Father     deceased   No Known Allergies Prior to Admission medications   Medication Sig Start Date End Date Taking? Authorizing Provider  acidophilus (RISAQUAD) CAPS capsule Take 1 capsule by mouth daily.   Yes Historical Provider, MD  Amino Acids-Protein Hydrolys (FEEDING SUPPLEMENT, PRO-STAT SUGAR FREE 64,) LIQD Take 30 mLs by mouth 3 (three) times daily with meals.   Yes Historical Provider, MD   aspirin 81 MG chewable tablet Chew 81 mg by mouth daily.   Yes Historical Provider, MD  calcium acetate (PHOSLO) 667 MG capsule Take 667 mg by mouth 3 (three) times daily with meals.  11/29/13  Yes Historical Provider, MD  calcium carbonate (TUMS - DOSED IN MG ELEMENTAL CALCIUM) 500 MG chewable tablet Chew 1 tablet by mouth 3 (three) times daily.   Yes Historical Provider, MD  folic acid (FOLVITE) 800 MCG tablet Take 800 mcg by mouth daily.   Yes Historical Provider, MD  lisinopril (PRINIVIL,ZESTRIL) 20 MG tablet Take 40 mg by mouth daily.  11/29/13  Yes Historical Provider, MD  metoprolol (LOPRESSOR) 50 MG tablet Take 50 mg by mouth 2 (two) times daily.   Yes Historical Provider, MD  minoxidil (LONITEN) 2.5 MG tablet Take 2.5 mg by mouth daily. Take 1 tablet Monday, weds, and friday   Yes Historical Provider, MD  Multiple Vitamins-Minerals (MULTIVITAMIN WITH MINERALS) tablet Take 1 tablet by mouth daily.   Yes Historical Provider, MD  NIFEdipine (PROCARDIA) 20 MG capsule Take 40 mg by mouth daily.   Yes Historical Provider, MD  omeprazole (PRILOSEC) 40 MG capsule Take 40 mg by mouth daily.  11/29/13  Yes Historical Provider, MD  terazosin (HYTRIN) 2 MG capsule Take 2 mg by mouth at bedtime.  11/29/13  Yes Historical Provider, MD  vitamin B-12 (CYANOCOBALAMIN) 250 MCG tablet Take 500 mcg by mouth daily.   Yes Historical Provider, MD   Dg Chest 1 View  10/17/2015  CLINICAL DATA:  Left hip fracture.  Preop evaluation. EXAM: CHEST 1 VIEW COMPARISON:  07/09/2014. FINDINGS: Enlarged cardiac silhouette with mild improvement. Bilateral pleural effusions, decreased on the left and without significant change other in the right other than interval partial loculation. Prominent pulmonary vasculature and interstitial markings with improvement. No acute bony abnormality. IMPRESSION: Cardiomegaly and changes of congestive heart failure with improvement. Electronically Signed   By: Beckie Salts M.D.   On: 10/17/2015  21:26   Dg Hip Unilat With Pelvis 2-3 Views Left  10/17/2015  CLINICAL DATA:  Status post fall tonight with a left hip injury and pain. Initial encounter. EXAM: DG HIP (WITH OR WITHOUT PELVIS) 2-3V LEFT COMPARISON:  None. FINDINGS: The patient has an acute, comminuted left intertrochanteric fracture. No other acute bony or joint abnormality is identified. Remote healed right intertrochanteric fracture with fixation hardware in place is noted. Lower lumbar spondylosis is identified. IMPRESSION: Acute left intertrochanteric fracture. Electronically Signed   By: Drusilla Kanner M.D.   On: 10/17/2015 21:03    Positive ROS: All other systems have been reviewed and were otherwise negative with the exception of those mentioned in the HPI and as above.  Physical Exam: General: Alert, no acute distress Cardiovascular: No pedal edema Respiratory: No cyanosis, no use of accessory musculature GI: No organomegaly, abdomen is soft and non-tender Skin: No lesions in the area of chief complaint Neurologic: Sensation intact distally Psychiatric: Patient is competent for consent with normal mood and affect Lymphatic: No axillary or cervical lymphadenopathy  MUSCULOSKELETAL: Patient's awake and alert.  The left leg is painful to movement at the hip.  There is some rotation and shortening.  The skin is intact.  Neurovascular status is intact distally.  He has previously healed incisions from a right hip fracture.  Motion is good.  No other injuries are noted.  Assessment: Comminuted intertrochanteric left hip fracture  Plan: Open reduction internal fixation left hip.  When cleared medically.    Valinda Hoar, MD [phone]   10/18/2015 7:57 AM

## 2015-10-18 NOTE — Progress Notes (Signed)
Patient return back from Dialysis, vital signs assessed,repositioned in bed, blanket applied per request. Dressing to the left arm- dry/clean/intact.

## 2015-10-18 NOTE — Clinical Social Work Placement (Signed)
   CLINICAL SOCIAL WORK PLACEMENT  NOTE  Date:  10/18/2015  Patient Details  Name: Walter [name] MRN: [id number] Date of Birth: [birth year]  Clinical Social Work is seeking post-discharge placement for this patient at the Skilled  Nursing Facility level of care (*CSW will initial, date and re-position this form in  chart as items are completed):  Yes   Patient/family provided with [facility] Clinical Social Work Department's list of facilities offering this level of care within the geographic area requested by the patient (or if unable, by the patient's family).  Yes   Patient/family informed of their freedom to choose among providers that offer the needed level of care, that participate in Medicare, Medicaid or managed care program needed by the patient, have an available bed and are willing to accept the patient.  Yes   Patient/family informed of [facility]'s ownership interest in [facility] and [facility], as well as of the fact that they are under no obligation to receive care at these facilities.  PASRR submitted to EDS on       PASRR number received on       Existing PASRR number confirmed on 10/18/15     FL2 transmitted to all facilities in geographic area requested by pt/family on 10/18/15     FL2 transmitted to all facilities within larger geographic area on       Patient informed that his/her managed care company has contracts with or will negotiate with certain facilities, including the following:            Patient/family informed of bed offers received.  Patient chooses bed at       Physician recommends and patient chooses bed at      Patient to be transferred to   on  .  Patient to be transferred to facility by       Patient family notified on   of transfer.  Name of family member notified:        PHYSICIAN       Additional Comment:    _______________________________________________ Haig Prophet, LCSW 10/18/2015, 11:58 AM

## 2015-10-18 NOTE — NC FL2 (Signed)
[place] MEDICAID FL2 LEVEL OF CARE SCREENING TOOL     IDENTIFICATION  Patient Name: Walter [name] Birthdate: 03-19-1922 Sex: male Admission Date (Current Location): 10/17/2015  [place] and [place] Number:  Chiropodist and Address:  [facility], [address], [place], [place] [address]      Provider Number: [id number]  Attending Physician Name and Address:  Katharina Caper, MD  Relative Name and Phone Number:       Current Level of Care: Hospital Recommended Level of Care: Skilled Nursing Facility Prior Approval Number:    Date Approved/Denied:   PASRR Number:  ( [id number] )  Discharge Plan: SNF    Current Diagnoses: Patient Active Problem List   Diagnosis Date Noted  . Fall 10/17/2015  . Hip fracture (HCC) 10/17/2015  . ICH (intracerebral hemorrhage) (HCC) 10/19/2014    Orientation RESPIRATION BLADDER Height & Weight     Self, Time, Situation, Place  O2 (2 Liters Oxygen ) Continent Weight: 153 lb 11.2 oz (69.718 kg) Height:   (177.8 cm)  BEHAVIORAL SYMPTOMS/MOOD NEUROLOGICAL BOWEL NUTRITION STATUS   (none )  (none )   Diet (Diet: 2 grams sodium. )  AMBULATORY STATUS COMMUNICATION OF NEEDS Skin   Extensive Assist Verbally Surgical wounds (Incision: Left Hip. )                       Personal Care Assistance Level of Assistance  Bathing, Feeding, Dressing Bathing Assistance: Limited assistance Feeding assistance: Independent Dressing Assistance: Limited assistance     Functional Limitations Info  Sight, Hearing, Speech Sight Info: Adequate Hearing Info: Impaired Speech Info: Adequate    SPECIAL CARE FACTORS FREQUENCY  PT (By licensed PT), OT (By licensed OT) (Dialysis ([facility],W,[facility] [address]) )     PT Frequency:  (5) OT Frequency:  (5)            Contractures      Additional Factors Info  Code Status Code Status Info:  (Full Code. )             Current Medications (10/18/2015):  This is the  current hospital active medication list Current Facility-Administered Medications  Medication Dose Route Frequency Provider Last Rate Last Dose  . 0.9 %  sodium chloride infusion   Intravenous Continuous Pavan Pyreddy, MD      . acetaminophen (TYLENOL) tablet 650 mg  650 mg Oral Q6H PRN Ihor Austin, MD       Or  . acetaminophen (TYLENOL) suppository 650 mg  650 mg Rectal Q6H PRN Ihor Austin, MD      . acidophilus (RISAQUAD) capsule 1 capsule  1 capsule Oral Daily Ihor Austin, MD   1 capsule at 10/18/15 0949  . calcium acetate (PHOSLO) capsule 667 mg  667 mg Oral TID WC Ihor Austin, MD   667 mg at 10/18/15 0946  . calcium carbonate (TUMS - dosed in mg elemental calcium) chewable tablet 200 mg of elemental calcium  1 tablet Oral TID Ihor Austin, MD   200 mg of elemental calcium at 10/18/15 0949  . cyanocobalamin tablet 500 mcg  500 mcg Oral Daily Ihor Austin, MD   500 mcg at 10/18/15 0949  . feeding supplement (PRO-STAT SUGAR FREE 64) liquid 30 mL  30 mL Oral TID WC Ihor Austin, MD   30 mL at 10/18/15 0947  . folic acid (FOLVITE) tablet 1 mg  1 mg Oral Daily Ihor Austin, MD   1 mg  at 10/18/15 0948  . HYDROcodone-acetaminophen (NORCO/VICODIN) 5-325 MG per tablet 1-2 tablet  1-2 tablet Oral Q4H PRN Ihor Austin, MD      . lisinopril (PRINIVIL,ZESTRIL) tablet 40 mg  40 mg Oral Daily Pavan Pyreddy, MD      . methocarbamol (ROBAXIN) tablet 500 mg  500 mg Oral Q6H PRN Deeann Saint, MD       Or  . methocarbamol (ROBAXIN) 500 mg in dextrose 5 % 50 mL IVPB  500 mg Intravenous Q6H PRN Deeann Saint, MD      . metoprolol (LOPRESSOR) tablet 50 mg  50 mg Oral BID Ihor Austin, MD   50 mg at 10/18/15 0230  . minoxidil (LONITEN) tablet 2.5 mg  2.5 mg Oral Daily Pavan Pyreddy, MD      . morphine 2 MG/ML injection 2 mg  2 mg Intravenous Q3H PRN Ihor Austin, MD   2 mg at 10/18/15 0954  . multivitamin with minerals tablet 1 tablet  1 tablet Oral Daily Ihor Austin, MD   1 tablet at 10/18/15  0947  . NIFEdipine (PROCARDIA) capsule 40 mg  40 mg Oral Daily Pavan Pyreddy, MD      . ondansetron (ZOFRAN) tablet 4 mg  4 mg Oral Q6H PRN Ihor Austin, MD       Or  . ondansetron (ZOFRAN) injection 4 mg  4 mg Intravenous Q6H PRN Pavan Pyreddy, MD      . pantoprazole (PROTONIX) EC tablet 40 mg  40 mg Oral Daily Ihor Austin, MD   40 mg at 10/18/15 0947  . senna (SENOKOT) tablet 8.6 mg  1 tablet Oral BID Deeann Saint, MD   8.6 mg at 10/17/15 2345  . senna-docusate (Senokot-S) tablet 1 tablet  1 tablet Oral QHS PRN Pavan Pyreddy, MD      . terazosin (HYTRIN) capsule 2 mg  2 mg Oral QHS Pavan Pyreddy, MD      . zolpidem (AMBIEN) tablet 5 mg  5 mg Oral QHS PRN Deeann Saint, MD         Discharge Medications: Please see discharge summary for a list of discharge medications.  Relevant Imaging Results:  Relevant Lab Results:   Additional Information  (SSN: [id number])  Haig Prophet, LCSW

## 2015-10-18 NOTE — Progress Notes (Addendum)
[facility] Physicians - [place] at [facility]   PATIENT NAME: Walter [name]    MR#:  [id number]  DATE OF BIRTH:  29-Sep-1921  SUBJECTIVE:  CHIEF COMPLAINT:   Chief Complaint  Patient presents with  . Fall   patient is a 82 year old Caucasian male with past medical history significant for history of renal failure, hypertension, AV fistula who presents to the hospital after a fall. Apparently patient was walking with a walker and tried to pick up something from the floor, lost his balance and fell down on the left hip. On arrival to emergency room, he was noted to have left intertrochanteric fracture and admitted to the hospital for treatment. He feels good today except of pain in his hip.  Patient denied any chest pains on exertion prior to coming to the hospital Review of Systems  Constitutional: Negative for fever, chills and weight loss.  HENT: Negative for congestion.   Eyes: Negative for blurred vision and double vision.  Respiratory: Negative for cough, sputum production, shortness of breath and wheezing.   Cardiovascular: Negative for chest pain, palpitations, orthopnea, leg swelling and PND.  Gastrointestinal: Negative for nausea, vomiting, abdominal pain, diarrhea, constipation and blood in stool.  Genitourinary: Negative for dysuria, urgency, frequency and hematuria.  Musculoskeletal: Negative for falls.  Neurological: Negative for dizziness, tremors, focal weakness and headaches.  Endo/Heme/Allergies: Does not bruise/bleed easily.  Psychiatric/Behavioral: Negative for depression. The patient does not have insomnia.     VITAL SIGNS: Blood pressure 104/38, pulse 59, temperature 97.9 F (36.6 C), temperature source Oral, resp. rate 11, height 5\' 10"  (1.778 m), weight 69.7 kg (153 lb 10.6 oz), SpO2 98 %.  PHYSICAL EXAMINATION:   GENERAL:  82 y.o.-year-old patient lying in the bed with no acute distress.  EYES: Pupils equal, round, reactive to light and accommodation.  No scleral icterus. Extraocular muscles intact.  HEENT: Head atraumatic, normocephalic. Oropharynx and nasopharynx clear.  NECK:  Supple, no jugular venous distention. No thyroid enlargement, no tenderness.  LUNGS: Normal breath sounds bilaterally, no wheezing, rales,rhonchi or crepitation. No use of accessory muscles of respiration.  CARDIOVASCULAR: S1, S2 normal. No murmurs, rubs, or gallops.  ABDOMEN: Soft, nontender, nondistended. Bowel sounds present. No organomegaly or mass.  EXTREMITIES: 2+ lower extremity and pedal edema, no cyanosis, or clubbing. Left leg is painful to palpation at hip area, patient has external rotation and shortening.  NEUROLOGIC: Cranial nerves II through XII are intact. Muscle strength 5/5 in all extremities. Sensation intact. Gait not checked.  PSYCHIATRIC: The patient is alert and oriented x 3.  SKIN: No obvious rash, lesion, or ulcer.   ORDERS/RESULTS REVIEWED:   CBC  Recent Labs Lab 10/17/15 2126 10/18/15 0500  WBC 4.7 6.8  HGB 10.0* 8.7*  HCT 30.9* 26.8*  PLT 138* 140*  MCV 93.2 92.7  MCH 30.1 30.2  MCHC 32.3 32.6  RDW 16.8* 16.9*  LYMPHSABS 0.7*  --   MONOABS 0.3  --   EOSABS 0.1  --   BASOSABS 0.0  --    ------------------------------------------------------------------------------------------------------------------  Chemistries   Recent Labs Lab 10/17/15 2126 10/18/15 0500  NA 136 134*  K 3.8 3.5  CL 96* 96*  CO2 33* 29  GLUCOSE 110* 113*  BUN 53* 57*  CREATININE 4.73* 4.92*  CALCIUM 8.3* 7.8*   ------------------------------------------------------------------------------------------------------------------ estimated creatinine clearance is 9.6 mL/min (by C-G formula based on Cr of 4.92). ------------------------------------------------------------------------------------------------------------------ No results for input(s): TSH, T4TOTAL, T3FREE, THYROIDAB in the last 72 hours.  Invalid input(s): FREET3  Cardiac  Enzymes No results for input(s): CKMB, TROPONINI, MYOGLOBIN in the last 168 hours.  Invalid input(s): CK ------------------------------------------------------------------------------------------------------------------ Invalid input(s): POCBNP ---------------------------------------------------------------------------------------------------------------  RADIOLOGY: Dg Chest 1 View  10/17/2015  CLINICAL DATA:  Left hip fracture.  Preop evaluation. EXAM: CHEST 1 VIEW COMPARISON:  07/09/2014. FINDINGS: Enlarged cardiac silhouette with mild improvement. Bilateral pleural effusions, decreased on the left and without significant change other in the right other than interval partial loculation. Prominent pulmonary vasculature and interstitial markings with improvement. No acute bony abnormality. IMPRESSION: Cardiomegaly and changes of congestive heart failure with improvement. Electronically Signed   By: Beckie Salts M.D.   On: 10/17/2015 21:26   Dg Hip Unilat With Pelvis 2-3 Views Left  10/17/2015  CLINICAL DATA:  Status post fall tonight with a left hip injury and pain. Initial encounter. EXAM: DG HIP (WITH OR WITHOUT PELVIS) 2-3V LEFT COMPARISON:  None. FINDINGS: The patient has an acute, comminuted left intertrochanteric fracture. No other acute bony or joint abnormality is identified. Remote healed right intertrochanteric fracture with fixation hardware in place is noted. Lower lumbar spondylosis is identified. IMPRESSION: Acute left intertrochanteric fracture. Electronically Signed   By: Drusilla Kanner M.D.   On: 10/17/2015 21:03    EKG:  Orders placed or performed during the hospital encounter of 10/17/15  . ED EKG  . ED EKG   EKG revealed poor R wave  progression in V3, but no acute ST-T changes ASSESSMENT AND PLAN:  Principal Problem:   Fall Active Problems:   Hip fracture (HCC)  #1 left intertrochanteric hip fracture. Initial encounter. Continue pain medications. Supportive therapy.  Patient will be undergoing surgery today after hemodialysis is completed by Dr. Deeann Saint #2. End-stage renal disease, hemodialysis Mondays this Fridays, patient will be undergoing hemodialysis today #3. Hyponatremia and fluid overloaded. Patient, follow sodium level after dialysis #4 bilateral lower extremity, swelling, likely due to fluid overload, follow with hemodialysis #5 history of essential hypertension, patient's blood pressure is well controlled, continue lisinopril, watch for hypotensive episodes #5, thrombocytopenia, likely consumption, follow with therapy #6. Anemia of chronic disease due to CK D/end-stage renal disease, follow postoperatively, transfuse as needed #7. Hyperglycemia, get hemoglobin A1c to rule out diabetes Management plans discussed with the patient, family and they are in agreement.   DRUG ALLERGIES: No Known Allergies  CODE STATUS:     Code Status Orders        Start     Ordered   10/18/15 0230  Full code   Continuous     10/18/15 0229    Code Status History    Date Active Date Inactive Code Status Order ID Comments User Context   10/17/2015 11:41 PM 10/18/2015  2:29 AM Full Code [id number]  Deeann Saint, MD ED   10/19/2014  5:22 PM 10/19/2014  8:28 PM Full Code [id number]  Coletta Memos, MD Inpatient    Advance Directive Documentation        Most Recent Value   Type of Advance Directive  Healthcare Power of Attorney   Pre-existing out of facility DNR order (yellow form or pink MOST form)     "MOST" Form in Place?        TOTAL TIME TAKING CARE OF THIS PATIENT: 40 minutes.    Katharina Caper M.D on 10/18/2015 at 1:03 PM  Between 7am to 6pm - Pager - [phone]  After 6pm go to [email or web address] - password [email or web address] [facility]  [place] [facility]  Office  [phone]  CC: Primary care physician; Verlin Grills  T, MD

## 2015-10-18 NOTE — Progress Notes (Signed)
Initial Nutrition Assessment   INTERVENTION:   Meals and Snacks: Cater to patient preferences; Recommend Renal Diet order as pt with h/o ESRD on HD if po intake adequate Medical Food Supplement Therapy: Pt currently ordered Pro-stat TID. Pt may benefit from Nepro instead if not taking Prostat adeqautely; Nepro Shake supplement provides 425 kcal and 19 grams protein   NUTRITION DIAGNOSIS:   Increased nutrient needs related to chronic illness, wound healing as evidenced by estimated needs.  GOAL:   Patient will meet greater than or equal to 90% of their needs  MONITOR:    (Energy Intake, Electrolyte and renal Profile, Anthropometrics, Digestive System)  REASON FOR ASSESSMENT:    (Dialysis Pt)    ASSESSMENT:   Pt admitted s/p fall with left hip fracture scheduled for ORIF. Pt with h/o ESRD on HD three times a week. Pt unavailable this am and out of the room at HD this afternoon on visits.  Past Medical History  Diagnosis Date  . Renal disorder   . Hypertension   . ESRD on dialysis [facility])      Diet Order:  Diet 2 gram sodium Room service appropriate?: Yes; Fluid consistency:: Thin Diet NPO time specified    Current Nutrition: Limited documentation of po intake currently, pt scheduled for NPO at midnight  Food/Nutrition-Related History: Per MST no decrease in appetite PTA.   Scheduled Medications:  . acidophilus  1 capsule Oral Daily  . calcium acetate  667 mg Oral TID WC  . calcium carbonate  1 tablet Oral TID  . cyanocobalamin  500 mcg Oral Daily  . feeding supplement (PRO-STAT SUGAR FREE 64)  30 mL Oral TID WC  . folic acid  1 mg Oral Daily  . lisinopril  40 mg Oral Daily  . metoprolol  50 mg Oral BID  . minoxidil  2.5 mg Oral Daily  . multivitamin with minerals  1 tablet Oral Daily  . NIFEdipine  40 mg Oral Daily  . pantoprazole  40 mg Oral Daily  . senna  1 tablet Oral BID  . terazosin  2 mg Oral QHS    Continuous Medications:  . sodium chloride        Electrolyte/Renal Profile and Glucose Profile:   Recent Labs Lab 10/17/15 2126 10/18/15 0500  NA 136 134*  K 3.8 3.5  CL 96* 96*  CO2 33* 29  BUN 53* 57*  CREATININE 4.73* 4.92*  CALCIUM 8.3* 7.8*  GLUCOSE 110* 113*   Protein Profile:  Recent Labs Lab 10/17/15 2126  ALBUMIN 2.9*    Gastrointestinal Profile: Last BM:  10/17/2015   Nutrition-Focused Physical Exam Findings:  Unable to complete Nutrition-Focused physical exam at this time.    Weight Change: Pt weight relatively stable in the past 9 months per [facility] weight encounters   Height:   Ht Readings from Last 1 Encounters:  10/18/15  (1.778 m)    Weight:   Wt Readings from Last 1 Encounters:  10/18/15 153 lb 10.6 oz (69.7 kg)   Wt Readings from Last 10 Encounters:  10/18/15 153 lb 10.6 oz (69.7 kg)  10/06/15 157 lb (71.215 kg)  03/23/15 152 lb (68.947 kg)  01/27/15 156 lb 11.9 oz (71.098 kg)  12/03/13 160 lb (72.576 kg)    BMI:  Body mass index is 22.05 kg/(m^2).   Estimated Nutritional Needs:   Kcal:  BEE: 1353kcals, TEE: (IF 1.2-1.4)(AF 1.2) 1948-2273kcals  Protein:  83-104g protein (1.2-1.5g/kg)  Fluid:  UOP+1L  EDUCATION NEEDS:  No education needs identified at this time   Clawson, RD, LDN Pager [phone] Weekend/On-Call Pager [phone]

## 2015-10-18 NOTE — H&P (Signed)
[facility] Physicians - [place] at [facility]   PATIENT NAME: Walter [name]    MR#:  [id number]  DATE OF BIRTH:  01-Feb-1922  DATE OF ADMISSION:  10/17/2015  PRIMARY CARE PHYSICIAN: Shelda Pal, MD   REQUESTING/REFERRING PHYSICIAN:   CHIEF COMPLAINT:   Chief Complaint  Patient presents with  . Fall    HISTORY OF PRESENT ILLNESS: Walter [name]  is a 82 y.o. male with a known history of end-stage renal disease on dialysis, hypertension presented to the emergency room with fall. Patient lives at [facility] assisted living facility. He was walking with a walker and tried to pick up a sweat or from the floor. He lost balance and fell down on the left hip. Patient has pain in the left hip which is sharp in nature 8 out of 10 on a scale of 1-10. No complaints of any chest pain or any shortness of breath. No history of any head injury. Imaging studies showed a left femur intertrochanteric fracture. Patient gets dialyzed on Monday Wednesday and Friday. No history of any syncope or seizure.  PAST MEDICAL HISTORY:   Past Medical History  Diagnosis Date  . Renal disorder   . Hypertension   . ESRD on dialysis [facility])     PAST SURGICAL HISTORY: Past Surgical History  Procedure Laterality Date  . Av fistula placement    . Hip surgery      SOCIAL HISTORY:  Social History  Substance Use Topics  . Smoking status: Never Smoker   . Smokeless tobacco: Not on file  . Alcohol Use: No    FAMILY HISTORY:  Family History  Problem Relation Age of Onset  . Heart disease Mother     deceased  . Heart disease Father     deceased    DRUG ALLERGIES: No Known Allergies  REVIEW OF SYSTEMS:   CONSTITUTIONAL: No fever, fatigue or weakness.  EYES: No blurred or double vision.  EARS, NOSE, AND THROAT: No tinnitus or ear pain.  RESPIRATORY: No cough, shortness of breath, wheezing or hemoptysis.  CARDIOVASCULAR: No chest pain, orthopnea, edema.  GASTROINTESTINAL: No nausea, vomiting,  diarrhea or abdominal pain.  GENITOURINARY: No dysuria, hematuria.  ENDOCRINE: No polyuria, nocturia,  HEMATOLOGY: No anemia, easy bruising or bleeding SKIN: No rash or lesion. MUSCULOSKELETAL: left hip pain present NEUROLOGIC: No tingling, numbness, weakness.  PSYCHIATRY: No anxiety or depression.   MEDICATIONS AT HOME:  Prior to Admission medications   Medication Sig Start Date End Date Taking? Authorizing Provider  acidophilus (RISAQUAD) CAPS capsule Take 1 capsule by mouth daily.   Yes Historical Provider, MD  Amino Acids-Protein Hydrolys (FEEDING SUPPLEMENT, PRO-STAT SUGAR FREE 64,) LIQD Take 30 mLs by mouth 3 (three) times daily with meals.   Yes Historical Provider, MD  aspirin 81 MG chewable tablet Chew 81 mg by mouth daily.   Yes Historical Provider, MD  calcium acetate (PHOSLO) 667 MG capsule Take 667 mg by mouth 3 (three) times daily with meals.  11/29/13  Yes Historical Provider, MD  calcium carbonate (TUMS - DOSED IN MG ELEMENTAL CALCIUM) 500 MG chewable tablet Chew 1 tablet by mouth 3 (three) times daily.   Yes Historical Provider, MD  folic acid (FOLVITE) 800 MCG tablet Take 800 mcg by mouth daily.   Yes Historical Provider, MD  lisinopril (PRINIVIL,ZESTRIL) 20 MG tablet Take 40 mg by mouth daily.  11/29/13  Yes Historical Provider, MD  metoprolol (LOPRESSOR) 50 MG tablet Take 50 mg by mouth 2 (  two) times daily.   Yes Historical Provider, MD  minoxidil (LONITEN) 2.5 MG tablet Take 2.5 mg by mouth daily. Take 1 tablet Monday, weds, and friday   Yes Historical Provider, MD  Multiple Vitamins-Minerals (MULTIVITAMIN WITH MINERALS) tablet Take 1 tablet by mouth daily.   Yes Historical Provider, MD  NIFEdipine (PROCARDIA) 20 MG capsule Take 40 mg by mouth daily.   Yes Historical Provider, MD  omeprazole (PRILOSEC) 40 MG capsule Take 40 mg by mouth daily.  11/29/13  Yes Historical Provider, MD  terazosin (HYTRIN) 2 MG capsule Take 2 mg by mouth at bedtime.  11/29/13  Yes Historical  Provider, MD  vitamin B-12 (CYANOCOBALAMIN) 250 MCG tablet Take 500 mcg by mouth daily.   Yes Historical Provider, MD      PHYSICAL EXAMINATION:   VITAL SIGNS: Blood pressure 174/48, pulse 58, temperature 97.4 F (36.3 C), temperature source Oral, resp. rate 18, height  (1.778 m), weight 74.844 kg (165 lb), SpO2 97 %.  GENERAL:  81 y.o.-year-old patient lying in the bed with no acute distress.  EYES: Pupils equal, round, reactive to light and accommodation. No scleral icterus. Extraocular muscles intact.  HEENT: Head atraumatic, normocephalic. Oropharynx and nasopharynx clear.  NECK:  Supple, no jugular venous distention. No thyroid enlargement, no tenderness.  LUNGS: Normal breath sounds bilaterally, no wheezing, rales,rhonchi or crepitation. No use of accessory muscles of respiration.  CARDIOVASCULAR: S1, S2 normal. No murmurs, rubs, or gallops.  ABDOMEN: Soft, nontender, nondistended. Bowel sounds present. No organomegaly or mass.  EXTREMITIES: No pedal edema, cyanosis, or clubbing. Left hip tenderness noted.AV fistula left fore arm. NEUROLOGIC: Cranial nerves II through XII are intact. Muscle strength 5/5 in all extremities. Sensation intact. Gait not checked.  PSYCHIATRIC: The patient is alert and oriented x 3.  SKIN: No obvious rash, lesion, or ulcer. AV fistula noted.  LABORATORY PANEL:   CBC  Recent Labs Lab 10/17/15 2126  WBC 4.7  HGB 10.0*  HCT 30.9*  PLT 138*  MCV 93.2  MCH 30.1  MCHC 32.3  RDW 16.8*  LYMPHSABS 0.7*  MONOABS 0.3  EOSABS 0.1  BASOSABS 0.0   ------------------------------------------------------------------------------------------------------------------  Chemistries   Recent Labs Lab 10/17/15 2126  NA 136  K 3.8  CL 96*  CO2 33*  GLUCOSE 110*  BUN 53*  CREATININE 4.73*  CALCIUM 8.3*   ------------------------------------------------------------------------------------------------------------------ estimated creatinine  clearance is 10.5 mL/min (by C-G formula based on Cr of 4.73). ------------------------------------------------------------------------------------------------------------------ No results for input(s): TSH, T4TOTAL, T3FREE, THYROIDAB in the last 72 hours.  Invalid input(s): FREET3   Coagulation profile  Recent Labs Lab 10/17/15 2126  INR 1.16   ------------------------------------------------------------------------------------------------------------------- No results for input(s): DDIMER in the last 72 hours. -------------------------------------------------------------------------------------------------------------------  Cardiac Enzymes No results for input(s): CKMB, TROPONINI, MYOGLOBIN in the last 168 hours.  Invalid input(s): CK ------------------------------------------------------------------------------------------------------------------ Invalid input(s): POCBNP  ---------------------------------------------------------------------------------------------------------------  Urinalysis    Component Value Date/Time   COLORURINE Yellow 11/17/2012 1729   APPEARANCEUR Clear 11/17/2012 1729   LABSPEC 1.010 11/17/2012 1729   PHURINE 9.0 11/17/2012 1729   GLUCOSEU 50 mg/dL 16/06/9603 5409   HGBUR Negative 11/17/2012 1729   BILIRUBINUR Negative 11/17/2012 1729   KETONESUR Negative 11/17/2012 1729   PROTEINUR 100 mg/dL 81/19/1478 2956   NITRITE Negative 11/17/2012 1729   LEUKOCYTESUR Trace 11/17/2012 1729     RADIOLOGY: Dg Chest 1 View  10/17/2015  CLINICAL DATA:  Left hip fracture.  Preop evaluation. EXAM: CHEST 1 VIEW COMPARISON:  07/09/2014. FINDINGS: Enlarged cardiac silhouette with mild  improvement. Bilateral pleural effusions, decreased on the left and without significant change other in the right other than interval partial loculation. Prominent pulmonary vasculature and interstitial markings with improvement. No acute bony abnormality. IMPRESSION: Cardiomegaly  and changes of congestive heart failure with improvement. Electronically Signed   By: Beckie Salts M.D.   On: 10/17/2015 21:26   Dg Hip Unilat With Pelvis 2-3 Views Left  10/17/2015  CLINICAL DATA:  Status post fall tonight with a left hip injury and pain. Initial encounter. EXAM: DG HIP (WITH OR WITHOUT PELVIS) 2-3V LEFT COMPARISON:  None. FINDINGS: The patient has an acute, comminuted left intertrochanteric fracture. No other acute bony or joint abnormality is identified. Remote healed right intertrochanteric fracture with fixation hardware in place is noted. Lower lumbar spondylosis is identified. IMPRESSION: Acute left intertrochanteric fracture. Electronically Signed   By: Drusilla Kanner M.D.   On: 10/17/2015 21:03    EKG: Orders placed or performed during the hospital encounter of 10/17/15  . ED EKG  . ED EKG    IMPRESSION AND PLAN: 82 year old male patient is a resident of assisted living facility had a fall. Imaging study showed left hip fracture. Admitting diagnosis 1. Accidental fall 2. Intertrochanteric fracture of left femur 3. End-stage renal disease on dialysis 4. Hypertension Treatment plan Admit patient to medical floor under inpatient service Orthopedic consultation Pain management with the oral Percocet and when necessary morphine Keep patient nothing by mouth . Nephrology consult for dialysis Supportive care.   All the records are reviewed and case discussed with ED provider. Management plans discussed with the patient, family and they are in agreement.  CODE STATUS:FULL    Code Status Orders        Start     Ordered   10/17/15 2340  Full code   Continuous     10/17/15 2341    Code Status History    Date Active Date Inactive Code Status Order ID Comments User Context   10/19/2014  5:22 PM 10/19/2014  8:28 PM Full Code [id number]  Coletta Memos, MD Inpatient    Advance Directive Documentation        Most Recent Value   Type of Advance Directive   Healthcare Power of Attorney   Pre-existing out of facility DNR order (yellow form or pink MOST form)     "MOST" Form in Place?         TOTAL TIME TAKING CARE OF THIS PATIENT: 50 minutes.    Ihor Austin M.D on 10/18/2015 at 12:09 AM  Between 7am to 6pm - Pager - [phone]  After 6pm go to [email or web address] - password [email or web address] [facility]  [place] [facility]  Office  [phone]  CC: Primary care physician; Shelda Pal, MD

## 2015-10-18 NOTE — Clinical Social Work Note (Signed)
Clinical Social Work Assessment  Patient Details  Name: Walter [name] MRN: [id number] Date of Birth: 1922-06-22  Date of referral:  10/18/15               Reason for consult:  Facility Placement, Other (Comment Required) (From [facility] )                Permission sought to share information with:  Chartered certified accountant granted to share information::  Yes, Verbal Permission Granted  Name::      Walter [name]::   Walter [name]   Relationship::     Contact Information:     Housing/Transportation Living arrangements for the past 2 months:  [place] of Information:  Patient, Power of Attorney Patient Interpreter Needed:  None Criminal Activity/Legal Involvement Pertinent to Current Situation/Hospitalization:  No - Comment as needed Significant Relationships:  Friend Lives with:  Facility Resident Do you feel safe going back to the place where you live?  Yes Need for family participation in patient care:  Yes (Comment)  Care giving concerns:  Patient is a resident at [facility].    Social Worker assessment / plan:  Holiday representative (CSW) received consult that patient is from [facility]. CSW contacted [facility] and spoke with Walter [name] this morning. Per [employer] patient has been at [facility] for several years and goes to dialysis at [facility] on [address]. CSW made [employer] aware that patient will be having surgery. Per [employer] if patient is appropriate for ALF level he can come back and received PT and OT at [facility]. Per RN in progression rounds patient will have surgery tomorrow 10/19/15. CSW met with patient and his pastor and 2 friends were at bedside. CSW introduced self and explained role of CSW department. Patient was alert and oriented and laying in the bed. Patient reported that he has lived at [facility] for 1.5 years and prefers to return there if he can. CSW explained that PT will work with patient  after surgery and make a recommendation. CSW explained SNF process. Patient is open to SNF if he needs it. Per patient his friends Walter [name] and Walter [name] [phone] are HPOA. Patient gave CSW permission to call Walter [name]. CSW contacted Walter [name] and made her aware of above. Per Walter [name] patient is DNR and she is bringing DNR and HPOA paper work to hospital now. Walter [name] is open to SNF.   FL2 completed and faxed out. CSW will continue to follow and assist as needed.   Employment status:  Systems developer information:  Medicare PT Recommendations:  Not assessed at this time Information / Referral to community resources:  [place]  Patient/Family's Response to care:  Patient prefers to return to [facility] however he is open to SNF.   Patient/Family's Understanding of and Emotional Response to Diagnosis, Current Treatment, and Prognosis: Patient was pleasant throughout assessment and thanked CSW for visit.   Emotional Assessment Appearance:  Appears stated age Attitude/Demeanor/Rapport:    Affect (typically observed):  Accepting, Adaptable, Pleasant Orientation:  Oriented to Self, Oriented to Place, Oriented to  Time, Oriented to Situation Alcohol / Substance use:  Not Applicable Psych involvement (Current and /or in the community):  No (Comment)  Discharge Needs  Concerns to be addressed:  Discharge Planning Concerns Readmission within the last 30 days:  No Current discharge risk:  Dependent with Mobility Barriers to Discharge:  Continued Medical Work up   Walter [name], Power 10/18/2015, 12:00 PM

## 2015-10-19 ENCOUNTER — Encounter: Payer: Self-pay | Admitting: Specialist

## 2015-10-19 ENCOUNTER — Encounter
Admission: EM | Disposition: A | Discharge status: Skilled Nursing Facility | Payer: Self-pay | Source: Home / Self Care | Attending: Internal Medicine

## 2015-10-19 ENCOUNTER — Inpatient Hospital Stay: Payer: Medicare Other | Admitting: Certified Registered"

## 2015-10-19 ENCOUNTER — Inpatient Hospital Stay: Payer: Medicare Other

## 2015-10-19 HISTORY — PX: INTRAMEDULLARY (IM) NAIL INTERTROCHANTERIC: SHX5875

## 2015-10-19 LAB — CBC
HCT: 27.7 % — ABNORMAL LOW (ref 40.0–52.0)
HCT: 26.2 % — ABNORMAL LOW (ref 40.0–52.0)
HEMOGLOBIN: 9 g/dL — AB (ref 13.0–18.0)
Hemoglobin: 8.5 g/dL — ABNORMAL LOW (ref 13.0–18.0)
MCH: 30 pg (ref 26.0–34.0)
MCH: 30 pg (ref 26.0–34.0)
MCHC: 32.6 g/dL (ref 32.0–36.0)
MCHC: 32.3 g/dL (ref 32.0–36.0)
MCV: 92.3 fL (ref 80.0–100.0)
MCV: 92.8 fL (ref 80.0–100.0)
PLATELETS: 138 10*3/uL — AB (ref 150–440)
PLATELETS: 122 10*3/uL — AB (ref 150–440)
RBC: 3 MIL/uL — ABNORMAL LOW (ref 4.40–5.90)
RBC: 2.82 MIL/uL — AB (ref 4.40–5.90)
RDW: 17 % — ABNORMAL HIGH (ref 11.5–14.5)
RDW: 16.7 % — AB (ref 11.5–14.5)
WBC: 7.3 10*3/uL (ref 3.8–10.6)
WBC: 7.9 10*3/uL (ref 3.8–10.6)

## 2015-10-19 LAB — SODIUM: Sodium: 140 mmol/L (ref 135–145)

## 2015-10-19 LAB — CREATININE, SERUM
Creatinine, Ser: 4.21 mg/dL — ABNORMAL HIGH (ref 0.61–1.24)
GFR calc Af Amer: 13 mL/min — ABNORMAL LOW (ref >60)
GFR calc non Af Amer: 11 mL/min — ABNORMAL LOW (ref >60)

## 2015-10-19 LAB — HEPATITIS B SURFACE ANTIBODY, QUANTITATIVE: HEPATITIS B-POST: 272.5 m[IU]/mL

## 2015-10-19 LAB — PHOSPHORUS: PHOSPHORUS: 4.1 mg/dL (ref 2.5–4.6)

## 2015-10-19 SURGERY — FIXATION, FRACTURE, INTERTROCHANTERIC, WITH INTRAMEDULLARY ROD
Anesthesia: General | Laterality: Left

## 2015-10-19 MED ORDER — MENTHOL 3 MG MT LOZG: 1.0000 | LOZENGE | OROMUCOSAL | Status: DC | PRN

## 2015-10-19 MED ORDER — LIDOCAINE-PRILOCAINE 2.5-2.5 % EX CREA
1.0000 "application " | TOPICAL_CREAM | CUTANEOUS | Status: DC | PRN
  Filled 2015-10-19: qty 5

## 2015-10-19 MED ORDER — SODIUM CHLORIDE FLUSH 0.9 % IV SOLN
INTRAVENOUS | Status: AC
  Filled 2015-10-19: qty 10

## 2015-10-19 MED ORDER — MAGNESIUM HYDROXIDE 400 MG/5ML PO SUSP: 30.0000 mL | Freq: Every day | ORAL | Status: DC | PRN

## 2015-10-19 MED ORDER — HEPARIN SODIUM (PORCINE) 1000 UNIT/ML DIALYSIS
1000.0000 [IU] | INTRAMUSCULAR | Status: DC | PRN
  Filled 2015-10-19: qty 1

## 2015-10-19 MED ORDER — ALTEPLASE 2 MG IJ SOLR
2.0000 mg | Freq: Once | INTRAMUSCULAR | Status: DC | PRN
  Filled 2015-10-19: qty 2

## 2015-10-19 MED ORDER — ACETAMINOPHEN 325 MG PO TABS
650.0000 mg | ORAL_TABLET | Freq: Four times a day (QID) | ORAL | Status: DC | PRN
  Administered 2015-10-20 – 2015-10-23 (×3): 650 mg via ORAL
  Filled 2015-10-19 (×3): qty 2

## 2015-10-19 MED ORDER — PENTAFLUOROPROP-TETRAFLUOROETH EX AERO
1.0000 "application " | INHALATION_SPRAY | CUTANEOUS | Status: DC | PRN
  Filled 2015-10-19: qty 30

## 2015-10-19 MED ORDER — ONDANSETRON HCL 4 MG/2ML IJ SOLN
INTRAMUSCULAR | Status: DC | PRN
  Administered 2015-10-19: 4 mg via INTRAVENOUS

## 2015-10-19 MED ORDER — CLINDAMYCIN PHOSPHATE 600 MG/50ML IV SOLN
600.0000 mg | Freq: Once | INTRAVENOUS | Status: AC
  Administered 2015-10-19: 600 mg via INTRAVENOUS

## 2015-10-19 MED ORDER — FENTANYL CITRATE (PF) 100 MCG/2ML IJ SOLN
INTRAMUSCULAR | Status: DC | PRN
  Administered 2015-10-19 (×4): 25 ug via INTRAVENOUS

## 2015-10-19 MED ORDER — ALUM & MAG HYDROXIDE-SIMETH 200-200-20 MG/5ML PO SUSP
30.0000 mL | ORAL | Status: DC | PRN
Start: 2015-10-19 — End: 2015-10-23

## 2015-10-19 MED ORDER — SENNA 8.6 MG PO TABS
1.0000 | ORAL_TABLET | Freq: Two times a day (BID) | ORAL | Status: DC
  Administered 2015-10-19 – 2015-10-21 (×4): 8.6 mg via ORAL
  Filled 2015-10-19 (×4): qty 1

## 2015-10-19 MED ORDER — ACETAMINOPHEN 500 MG PO TABS
1000.0000 mg | ORAL_TABLET | Freq: Four times a day (QID) | ORAL | Status: AC
  Administered 2015-10-19 – 2015-10-20 (×4): 1000 mg via ORAL
  Filled 2015-10-19 (×4): qty 2

## 2015-10-19 MED ORDER — ONDANSETRON HCL 4 MG/2ML IJ SOLN: 4.0000 mg | Freq: Once | INTRAMUSCULAR | Status: DC | PRN

## 2015-10-19 MED ORDER — SODIUM CHLORIDE 0.45 % IV SOLN
INTRAVENOUS | Status: DC
  Administered 2015-10-19 – 2015-10-20 (×2): via INTRAVENOUS

## 2015-10-19 MED ORDER — BISACODYL 10 MG RE SUPP
10.0000 mg | Freq: Every day | RECTAL | Status: DC | PRN
  Administered 2015-10-21: 10 mg via RECTAL
  Filled 2015-10-19: qty 1

## 2015-10-19 MED ORDER — CEFAZOLIN SODIUM-DEXTROSE 2-3 GM-% IV SOLR
2.0000 g | Freq: Once | INTRAVENOUS | Status: AC
  Administered 2015-10-19: 2 g via INTRAVENOUS

## 2015-10-19 MED ORDER — FERROUS SULFATE 325 (65 FE) MG PO TABS
325.0000 mg | ORAL_TABLET | Freq: Every day | ORAL | Status: DC
  Administered 2015-10-21 – 2015-10-22 (×2): 325 mg via ORAL
  Filled 2015-10-19 (×2): qty 1

## 2015-10-19 MED ORDER — HYDROCODONE-ACETAMINOPHEN 5-325 MG PO TABS
1.0000 | ORAL_TABLET | Freq: Four times a day (QID) | ORAL | Status: DC | PRN
  Administered 2015-10-20 – 2015-10-22 (×6): 1 via ORAL
  Filled 2015-10-19 (×6): qty 1

## 2015-10-19 MED ORDER — EPHEDRINE SULFATE 50 MG/ML IJ SOLN
INTRAMUSCULAR | Status: DC | PRN
  Administered 2015-10-19 (×2): 10 mg via INTRAVENOUS
  Administered 2015-10-19: 5 mg via INTRAVENOUS

## 2015-10-19 MED ORDER — ACETAMINOPHEN 650 MG RE SUPP: 650.0000 mg | Freq: Four times a day (QID) | RECTAL | Status: DC | PRN

## 2015-10-19 MED ORDER — ACETAMINOPHEN 10 MG/ML IV SOLN
INTRAVENOUS | Status: DC | PRN
  Administered 2015-10-19: 1000 mg via INTRAVENOUS

## 2015-10-19 MED ORDER — KETAMINE HCL 10 MG/ML IJ SOLN
INTRAMUSCULAR | Status: DC | PRN
  Administered 2015-10-19 (×2): 10 mg via INTRAVENOUS

## 2015-10-19 MED ORDER — LIDOCAINE HCL (PF) 1 % IJ SOLN
5.0000 mL | INTRAMUSCULAR | Status: DC | PRN
  Filled 2015-10-19: qty 5

## 2015-10-19 MED ORDER — MORPHINE SULFATE (PF) 2 MG/ML IV SOLN: 1.0000 mg | INTRAVENOUS | Status: DC | PRN

## 2015-10-19 MED ORDER — ACETAMINOPHEN 10 MG/ML IV SOLN
INTRAVENOUS | Status: AC
  Filled 2015-10-19: qty 100

## 2015-10-19 MED ORDER — ONDANSETRON HCL 4 MG PO TABS: 4.0000 mg | ORAL_TABLET | Freq: Four times a day (QID) | ORAL | Status: DC | PRN

## 2015-10-19 MED ORDER — SODIUM CHLORIDE 0.9 % IV SOLN: 100.0000 mL | INTRAVENOUS | Status: DC | PRN

## 2015-10-19 MED ORDER — CEFAZOLIN SODIUM-DEXTROSE 2-3 GM-% IV SOLR
INTRAVENOUS | Status: AC
  Filled 2015-10-19: qty 50

## 2015-10-19 MED ORDER — METOCLOPRAMIDE HCL 10 MG PO TABS: 5.0000 mg | ORAL_TABLET | Freq: Three times a day (TID) | ORAL | Status: DC | PRN

## 2015-10-19 MED ORDER — PHENYLEPHRINE HCL 10 MG/ML IJ SOLN
INTRAMUSCULAR | Status: DC | PRN
  Administered 2015-10-19: 50 ug via INTRAVENOUS

## 2015-10-19 MED ORDER — BUPIVACAINE-EPINEPHRINE 0.5% -1:200000 IJ SOLN
INTRAMUSCULAR | Status: DC | PRN
  Administered 2015-10-19: 30 mL

## 2015-10-19 MED ORDER — METOCLOPRAMIDE HCL 5 MG/ML IJ SOLN: 5.0000 mg | Freq: Three times a day (TID) | INTRAMUSCULAR | Status: DC | PRN

## 2015-10-19 MED ORDER — FENTANYL CITRATE (PF) 100 MCG/2ML IJ SOLN: 25.0000 ug | INTRAMUSCULAR | Status: DC | PRN

## 2015-10-19 MED ORDER — CLINDAMYCIN PHOSPHATE 600 MG/50ML IV SOLN
INTRAVENOUS | Status: AC
Start: 2015-10-19 — End: 2015-10-19
  Filled 2015-10-19: qty 50

## 2015-10-19 MED ORDER — CLINDAMYCIN PHOSPHATE 600 MG/50ML IV SOLN
600.0000 mg | Freq: Three times a day (TID) | INTRAVENOUS | Status: AC
  Administered 2015-10-19 – 2015-10-20 (×3): 600 mg via INTRAVENOUS
  Filled 2015-10-19 (×4): qty 50

## 2015-10-19 MED ORDER — ENOXAPARIN SODIUM 30 MG/0.3ML ~~LOC~~ SOLN: 30.0000 mg | SUBCUTANEOUS | Status: DC

## 2015-10-19 MED ORDER — ZOLPIDEM TARTRATE 5 MG PO TABS: 5.0000 mg | ORAL_TABLET | Freq: Every evening | ORAL | Status: DC | PRN

## 2015-10-19 MED ORDER — PROPOFOL 10 MG/ML IV BOLUS
INTRAVENOUS | Status: DC | PRN
  Administered 2015-10-19: 120 mg via INTRAVENOUS

## 2015-10-19 MED ORDER — PHENOL 1.4 % MT LIQD: 1.0000 | OROMUCOSAL | Status: DC | PRN

## 2015-10-19 MED ORDER — ONDANSETRON HCL 4 MG/2ML IJ SOLN
4.0000 mg | Freq: Four times a day (QID) | INTRAMUSCULAR | Status: DC | PRN
  Administered 2015-10-19: 4 mg via INTRAVENOUS
  Filled 2015-10-19: qty 2

## 2015-10-19 MED ORDER — LIDOCAINE HCL (CARDIAC) 20 MG/ML IV SOLN
INTRAVENOUS | Status: DC | PRN
  Administered 2015-10-19: 50 mg via INTRAVENOUS

## 2015-10-19 MED ORDER — CEFAZOLIN SODIUM-DEXTROSE 2-3 GM-% IV SOLR
2.0000 g | Freq: Four times a day (QID) | INTRAVENOUS | Status: AC
  Administered 2015-10-19 (×2): 2 g via INTRAVENOUS
  Filled 2015-10-19 (×2): qty 50

## 2015-10-19 MED ORDER — HEPARIN SODIUM (PORCINE) 5000 UNIT/ML IJ SOLN
5000.0000 [IU] | Freq: Three times a day (TID) | INTRAMUSCULAR | Status: DC
  Administered 2015-10-20 – 2015-10-23 (×8): 5000 [IU] via SUBCUTANEOUS
  Filled 2015-10-19 (×8): qty 1

## 2015-10-19 MED ORDER — NEOMYCIN-POLYMYXIN B GU 40-200000 IR SOLN
Status: DC | PRN
  Administered 2015-10-19: 2 mL

## 2015-10-19 MED ORDER — FLEET ENEMA 7-19 GM/118ML RE ENEM: 1.0000 | ENEMA | Freq: Once | RECTAL | Status: DC | PRN

## 2015-10-19 SURGICAL SUPPLY — 31 items
BIT DRILL GRAY 5X250 (MISCELLANEOUS) IMPLANT
BIT DRILL RADIO 4.0 (BIT) IMPLANT
CANISTER SUCT 1200ML W/VALVE (MISCELLANEOUS) ×3 IMPLANT
CHLORAPREP W/TINT 26ML (MISCELLANEOUS) ×6 IMPLANT
DRAPE INCISE 23X17 IOBAN STRL (DRAPES) ×2
DRAPE INCISE IOBAN 23X17 STRL (DRAPES) ×1 IMPLANT
DRSG AQUACEL AG ADV 3.5X10 (GAUZE/BANDAGES/DRESSINGS) ×6 IMPLANT
ELECT REM PT RETURN 9FT ADLT (ELECTROSURGICAL) ×3
ELECTRODE REM PT RTRN 9FT ADLT (ELECTROSURGICAL) ×1 IMPLANT
GAUZE PETRO XEROFOAM 1X8 (MISCELLANEOUS) IMPLANT
GAUZE SPONGE 4X4 12PLY STRL (GAUZE/BANDAGES/DRESSINGS) IMPLANT
GLOVE INDICATOR 8.0 STRL GRN (GLOVE) ×3 IMPLANT
GLOVE SURG ORTHO 8.5 STRL (GLOVE) ×3 IMPLANT
GOWN STRL REUS W/ TWL LRG LVL3 (GOWN DISPOSABLE) ×2 IMPLANT
GOWN STRL REUS W/TWL LRG LVL3 (GOWN DISPOSABLE) ×4
GUIDEWIRE 3.2X400 (WIRE) ×6 IMPLANT
INACTIVE NO USAGE (Bolt) ×3 IMPLANT
KIT RM TURNOVER STRD PROC AR (KITS) ×3 IMPLANT
MAT BLUE FLOOR 46X72 FLO (MISCELLANEOUS) ×3 IMPLANT
NAIL TROCH FX 11/130D 170-S (Nail) ×3 IMPLANT
NEEDLE SPNL 18GX3.5 QUINCKE PK (NEEDLE) ×3 IMPLANT
NS IRRIG 500ML POUR BTL (IV SOLUTION) ×3 IMPLANT
PACK HIP COMPR (MISCELLANEOUS) ×3 IMPLANT
REAMER ROD DEEP FLUTE 2.5X950 (INSTRUMENTS) ×3 IMPLANT
STAPLER SKIN PROX 35W (STAPLE) ×3 IMPLANT
SUCTION FRAZIER HANDLE 10FR (MISCELLANEOUS) ×2
SUCTION TUBE FRAZIER 10FR DISP (MISCELLANEOUS) ×1 IMPLANT
SUT VIC AB 0 CT1 36 (SUTURE) IMPLANT
SUT VIC AB 2-0 CT1 27 (SUTURE) ×2
SUT VIC AB 2-0 CT1 TAPERPNT 27 (SUTURE) ×1 IMPLANT
SYR 30ML LL (SYRINGE) ×3 IMPLANT

## 2015-10-19 NOTE — Progress Notes (Signed)
[facility] [place] at [facility]   PATIENT NAME: Walter [name]    MR#:  [id number]  DATE OF BIRTH:  24-Jul-1922  SUBJECTIVE:  CHIEF COMPLAINT:   Chief Complaint  Patient presents with  . Fall   - Left hip surgery done today, had nausea/vomiting today - dialysis tomorrow - left hip pain  REVIEW OF SYSTEMS:  Review of Systems  Constitutional: Positive for malaise/fatigue. Negative for fever and chills.  HENT: Negative for ear discharge, ear pain and nosebleeds.   Respiratory: Positive for cough. Negative for shortness of breath and wheezing.   Cardiovascular: Negative for chest pain, palpitations and leg swelling.  Gastrointestinal: Positive for nausea and vomiting. Negative for abdominal pain, diarrhea and constipation.  Genitourinary: Negative for dysuria and frequency.  Musculoskeletal: Positive for joint pain. Negative for myalgias.  Neurological: Negative for dizziness, sensory change, speech change, focal weakness, seizures and headaches.    DRUG ALLERGIES:  No Known Allergies  VITALS:  Blood pressure 122/39, pulse 59, temperature 97.7 F (36.5 C), temperature source Oral, resp. rate 18, height  (1.778 m), weight 69.7 kg (153 lb 10.6 oz), SpO2 100 %.  PHYSICAL EXAMINATION:  Physical Exam  GENERAL:  82 y.o.-year-old patient lying in the bed with no acute distress.  EYES: Pupils equal, round, reactive to light and accommodation. No scleral icterus. Extraocular muscles intact.  HEENT: Head atraumatic, normocephalic. Oropharynx and nasopharynx clear.  NECK:  Supple, no jugular venous distention. No thyroid enlargement, no tenderness.  LUNGS: Normal breath sounds bilaterally, no wheezing, rales,rhonchi or crepitation. No use of accessory muscles of respiration. Decreased bibasilar breath sounds CARDIOVASCULAR: S1, S2 normal. No rubs, or gallops. 3/6 systolic murmur present ABDOMEN: Soft, nontender, nondistended. Bowel sounds present. No  organomegaly or mass.  EXTREMITIES: No pedal edema, cyanosis, or clubbing. Left forearm AV fistula NEUROLOGIC: Cranial nerves II through XII are intact. Muscle strength 5/5 in all extremities except left leg due to hip pain. Sensation intact. Gait not checked.  PSYCHIATRIC: The patient is alert and oriented x 3.  SKIN: No obvious rash, lesion, or ulcer.    LABORATORY PANEL:   CBC  Recent Labs Lab 10/19/15 1400  WBC 7.9  HGB 8.5*  HCT 26.2*  PLT 122*   ------------------------------------------------------------------------------------------------------------------  Chemistries   Recent Labs Lab 10/18/15 0500 10/19/15 0547 10/19/15 1400  NA 134* 140  --   K 3.5  --   --   CL 96*  --   --   CO2 29  --   --   GLUCOSE 113*  --   --   BUN 57*  --   --   CREATININE 4.92*  --  4.21*  CALCIUM 7.8*  --   --    ------------------------------------------------------------------------------------------------------------------  Cardiac Enzymes No results for input(s): TROPONINI in the last 168 hours. ------------------------------------------------------------------------------------------------------------------  RADIOLOGY:  Dg Chest 1 View  10/17/2015  CLINICAL DATA:  Left hip fracture.  Preop evaluation. EXAM: CHEST 1 VIEW COMPARISON:  07/09/2014. FINDINGS: Enlarged cardiac silhouette with mild improvement. Bilateral pleural effusions, decreased on the left and without significant change other in the right other than interval partial loculation. Prominent pulmonary vasculature and interstitial markings with improvement. No acute bony abnormality. IMPRESSION: Cardiomegaly and changes of congestive heart failure with improvement. Electronically Signed   By: Beckie Salts M.D.   On: 10/17/2015 21:26   Dg Hip Operative Unilat With Pelvis Left  10/19/2015  CLINICAL DATA:  1 min 6 secs fluoro time EXAM: OPERATIVE LEFT  HIP (WITH PELVIS IF PERFORMED) 3 VIEWS TECHNIQUE: Fluoroscopic spot  image(s) were submitted for interpretation post-operatively. COMPARISON:  10/17/2015 FINDINGS: Three images demonstrate intra medullary nail and lag screw fixation of comminuted intertrochanteric fracture of the left hip. No interval fractures are identified. IMPRESSION: ORIF of the left hip. Electronically Signed   By: Norva Pavlov M.D.   On: 10/19/2015 12:33   Dg Hip Unilat With Pelvis 2-3 Views Left  10/17/2015  CLINICAL DATA:  Status post fall tonight with a left hip injury and pain. Initial encounter. EXAM: DG HIP (WITH OR WITHOUT PELVIS) 2-3V LEFT COMPARISON:  None. FINDINGS: The patient has an acute, comminuted left intertrochanteric fracture. No other acute bony or joint abnormality is identified. Remote healed right intertrochanteric fracture with fixation hardware in place is noted. Lower lumbar spondylosis is identified. IMPRESSION: Acute left intertrochanteric fracture. Electronically Signed   By: Drusilla Kanner M.D.   On: 10/17/2015 21:03    EKG:   Orders placed or performed during the hospital encounter of 10/17/15  . ED EKG  . ED EKG    ASSESSMENT AND PLAN:   82y/oM with PMH of ESRD on M-W-F hemodialysis, HTN, Anemia admitted after a fall from Assisted living facility and left hip Fracture  #1 Left Hip fracture- intertronchanteric hip fracture s/p ORIF today - Appreciate orthopedics. -Continue pain management and also will be started on DVT prophylaxis today. -Physical therapy consult for tomorrow.  #2 end-stage renal disease on hemodialysis-on vent Mondays, Wednesdays and Fridays dialysis. Last dialysis yesterday. -Appreciate nephrology consult. Due for dialysis tomorrow  #3 anemia of chronic disease due to end-stage renal disease-follow-up hemoglobin tomorrow after surgery. Transfuse as needed if hemoglobin is trending down.  #4 hypertension- Since blood pressure is low normal after surgery, will hold his antihypertensives including lisinopril, Terazosin, metoprolol  and Procardia  #5 nausea vomiting-started clear liquids. Likely from anesthesia. Zofran as needed.  #6 DVT prophylaxis-started on subcutaneous heparin after surgery   Physical therapy consult tomorrow    All the records are reviewed and case discussed with Care Management/Social Workerr. Management plans discussed with the patient, family and they are in agreement.  CODE STATUS: Full Code  TOTAL TIME TAKING CARE OF THIS PATIENT: 37 minutes.   POSSIBLE D/C IN 2 DAYS, DEPENDING ON CLINICAL CONDITION.   Curtisha Bendix M.D on 10/19/2015 at 4:51 PM  Between 7am to 6pm - Pager - [phone]  After 6pm go to [email or web address] - password [email or web address] [facility]  [place] [facility]  Office  [phone]  CC: Primary care physician; Shelda Pal, MD

## 2015-10-19 NOTE — Op Note (Signed)
DATE OF SURGERY:  10/19/2015  TIME: 11:47 AM  PATIENT NAME:  Walter [name]  AGE: 82 y.o.  PRE-OPERATIVE DIAGNOSIS:  Fractured left hip  POST-OPERATIVE DIAGNOSIS:  SAME  PROCEDURE:  INTRAMEDULLARY (IM) NAIL INTERTROCHANTRIC LEFT HIP  SURGEON:  Brayln Duque E  EBL: 100cc  COMPLICATIONS: none  OPERATIVE IMPLANTS: Synthes trochanteric femoral nail 3mm/130* with interlocking helical blade  and distal locking screw 42mm  PREOPERATIVE INDICATIONS:  [name] is a 82 y.o. year old who fell and suffered a hip fracture. He was brought into the ER and then admitted and optimized and then elected for surgical intervention.    The risks benefits and alternatives were discussed with the patient including but not limited to the risks of nonoperative treatment, versus surgical intervention including infection, bleeding, nerve injury, malunion, nonunion, hardware prominence, hardware failure, need for hardware removal, blood clots, cardiopulmonary complications, morbidity, mortality, among others, and they were willing to proceed.    OPERATIVE PROCEDURE:  The patient was brought to the operating room and placed in the supine position. General anesthesia was administered, with a foley. He was placed on the fracture table.  Closed reduction was performed under C-arm guidance. The length of the femur was also measured using fluoroscopy. Time out was then performed after sterile prep and drape. He received preoperative antibiotics.  Incision was made proximal to the greater trochanter. A guidewire was placed in the appropriate position. Confirmation was made on AP and lateral views. The above-named nail was opened. I opened the proximal femur with a reamer. I then placed the nail by hand easily down. I did not need to ream the femur.  Once the nail was completely seated, I placed a guidepin into the femoral head into the center center position through a second incision.  I measured the length, and then  reamed the lateral cortex and up into the head. I then placed the helical blade. Slight compression was applied. Anatomic fixation achieved. Bone quality was mediocre.  I then secured the proximal interlock.  The distal locking screw was then placed and after confirming the position of the fracture fragments and hardware I then removed the instruments, and took final C-arm pictures AP and lateral the entire length of the leg. Anatomic reconstruction was achieved, and the wounds were irrigated copiously and closed with Vicryl  followed by staples and Aquacel for the skin. Sponge and needle count were correct.   The patient was awakened and returned to PACU in stable and satisfactory condition. There no complications and the patient tolerated the procedure well.  He will be partial weightbearing as tolerated, and will be on Lovenox  For DVT prophylaxis.     Valinda Hoar, M.D.

## 2015-10-19 NOTE — Progress Notes (Signed)
Transported to or via bed

## 2015-10-19 NOTE — Progress Notes (Signed)
Clinical Education officer, museum (CSW) met with patient's HPOA Seth Bake at bedside after surgery. CSW presented bed offers to [facility]. She chose [facility] CSW contacted [facility] admissions coordinator at [facility] and made her aware of accepted bed offer. CSW also made Christy aware that patient goes to dialysis on [address]. CSW left Maudie Mercury dialysis liaison a voicemail making her aware of above. CSW will continue to follow and assist as needed.   Blima Rich, LCSW [phone]

## 2015-10-19 NOTE — Anesthesia Postprocedure Evaluation (Signed)
Anesthesia Post Note  Patient: Walter [name]  Procedure(s) Performed: Procedure(s) (LRB): INTRAMEDULLARY (IM) NAIL INTERTROCHANTRIC (Left)  Patient location during evaluation: PACU Anesthesia Type: General Level of consciousness: awake and alert Pain management: pain level controlled Vital Signs Assessment: post-procedure vital signs reviewed and stable Respiratory status: spontaneous breathing and respiratory function stable Cardiovascular status: stable Anesthetic complications: no    Last Vitals:  Filed Vitals:   10/19/15 1338 10/19/15 1431  BP: 147/43 138/38  Pulse: 59 61  Temp: 36.6 C 36.6 C  Resp: 18 18    Last Pain:  Filed Vitals:   10/19/15 1431  PainSc: Asleep                 Romon Devereux K

## 2015-10-19 NOTE — Progress Notes (Signed)
Nutrition Follow-up:  Called by RN, Elnita Maxwell Nurse Supervisor regarding family's concern regarding pt vomiting after eating solid food tray for lunch today and wanting suggestions for foods to eat that will not make pt get sick.   Diet order has been changed to clear liquids, per RN, Steward Drone secondary to vomiting.  Discussed clear liquid options with pt and wife.  Wife reports pt does not like chicken or [place].    Will follow-up regarding diet tolerance  MODERATE Care Level  Walter [name] B. Freida Busman, RD, LDN [phone] (pager) Weekend/On-Call pager [phone])

## 2015-10-19 NOTE — Transfer of Care (Signed)
Immediate Anesthesia Transfer of Care Note  Patient: Walter [name]  Procedure(s) Performed: Procedure(s): INTRAMEDULLARY (IM) NAIL INTERTROCHANTRIC (Left)  Patient Location: PACU  Anesthesia Type:General  Level of Consciousness: awake  Airway & Oxygen Therapy: Patient Spontanous Breathing and Patient connected to face mask oxygen  Post-op Assessment: Report given to RN  Post vital signs: Reviewed  Last Vitals:  Filed Vitals:   10/19/15 0950 10/19/15 1156  BP: 160/48 130/48  Pulse: 58 65  Temp: 36.4 C 37.1 C  Resp: 16 13    Complications: No apparent anesthesia complications

## 2015-10-19 NOTE — Care Management Important Message (Signed)
Important Message  Patient Details  Name: Walter [name] MRN: [id number] Date of Birth: [birth year]   Medicare Important Message Given:  Yes    Olegario Messier A Daisy Mcneel 10/19/2015, 10:20 AM

## 2015-10-19 NOTE — Progress Notes (Signed)
[facility]  ROUNDING NOTE   Subjective:  Patient known to [place] from prior admissions. Came in with left hip fracture. Pt had IM nail placed today by Dr. Hyacinth Meeker. EBL only 100cc. Had HD yesterday. Due for HD again tomorrow.   Objective:  Vital signs in last 24 hours:  Temp:  [97.5 F (36.4 C)-99.9 F (37.7 C)] 97.7 F (36.5 C) (02/09 1531) Pulse Rate:  [55-66] 59 (02/09 1629) Resp:  [10-18] 18 (02/09 1629) BP: (89-160)/(28-48) 122/39 mmHg (02/09 1629) SpO2:  [97 %-100 %] 100 % (02/09 1629)  Weight change: -5.144 kg (-11 lb 5.4 oz) Filed Weights   10/17/15 2019 10/18/15 0122 10/18/15 1207  Weight: 74.844 kg (165 lb) 69.718 kg (153 lb 11.2 oz) 69.7 kg (153 lb 10.6 oz)    Intake/Output: I/O last 3 completed shifts: In: 394 [P.O.:240; I.V.:154] Out: 1500 [Other:1500]   Intake/Output this shift:  Total I/O In: 350 [I.V.:350] Out: -   Physical Exam: General: NAD, resting in bed  Head: Normocephalic, atraumatic. Moist oral mucosal membranes  Eyes: Anicteric  Neck: Supple, trachea midline  Lungs:  Clear to auscultation normal effort  Heart: S1S2 no rubs  Abdomen:  Soft, nontender, BS present   Extremities:  trace peripheral edema.  Neurologic: Nonfocal, moving all four extremities  Skin: No lesions  Access: LUE AVF    Basic Metabolic Panel:  Recent Labs Lab 10/17/15 2126 10/18/15 0500 10/19/15 0547 10/19/15 1400  NA 136 134* 140  --   K 3.8 3.5  --   --   CL 96* 96*  --   --   CO2 33* 29  --   --   GLUCOSE 110* 113*  --   --   BUN 53* 57*  --   --   CREATININE 4.73* 4.92*  --  4.21*  CALCIUM 8.3* 7.8*  --   --   PHOS  --   --   --  4.1    Liver Function Tests:  Recent Labs Lab 10/17/15 2126  ALBUMIN 2.9*   No results for input(s): LIPASE, AMYLASE in the last 168 hours. No results for input(s): AMMONIA in the last 168 hours.  CBC:  Recent Labs Lab 10/17/15 2126 10/18/15 0500 10/19/15 0547 10/19/15 1400  WBC 4.7 6.8 7.3 7.9   NEUTROABS 3.5  --   --   --   HGB 10.0* 8.7* 9.0* 8.5*  HCT 30.9* 26.8* 27.7* 26.2*  MCV 93.2 92.7 92.3 92.8  PLT 138* 140* 138* 122*    Cardiac Enzymes: No results for input(s): CKTOTAL, CKMB, CKMBINDEX, TROPONINI in the last 168 hours.  BNP: Invalid input(s): POCBNP  CBG: No results for input(s): GLUCAP in the last 168 hours.  Microbiology: Results for orders placed or performed during the hospital encounter of 10/17/15  MRSA PCR Screening     Status: None   Collection Time: 10/18/15  5:15 AM  Result Value Ref Range Status   MRSA by PCR NEGATIVE NEGATIVE Final    Comment:        The GeneXpert MRSA Assay (FDA approved for NASAL specimens only), is one component of a comprehensive MRSA colonization surveillance program. It is not intended to diagnose MRSA infection nor to guide or monitor treatment for MRSA infections.     Coagulation Studies:  Recent Labs  10/17/15 2126 10/18/15 0500  LABPROT 15.0 16.2*  INR 1.16 1.29    Urinalysis: No results for input(s): COLORURINE, LABSPEC, PHURINE, GLUCOSEU, HGBUR, BILIRUBINUR, KETONESUR, PROTEINUR, UROBILINOGEN, NITRITE,  LEUKOCYTESUR in the last 72 hours.  Invalid input(s): APPERANCEUR    Imaging: Dg Chest 1 View  10/17/2015  CLINICAL DATA:  Left hip fracture.  Preop evaluation. EXAM: CHEST 1 VIEW COMPARISON:  07/09/2014. FINDINGS: Enlarged cardiac silhouette with mild improvement. Bilateral pleural effusions, decreased on the left and without significant change other in the right other than interval partial loculation. Prominent pulmonary vasculature and interstitial markings with improvement. No acute bony abnormality. IMPRESSION: Cardiomegaly and changes of congestive heart failure with improvement. Electronically Signed   By: Beckie Salts M.D.   On: 10/17/2015 21:26   Dg Hip Operative Unilat With Pelvis Left  10/19/2015  CLINICAL DATA:  1 min 6 secs fluoro time EXAM: OPERATIVE LEFT HIP (WITH PELVIS IF PERFORMED) 3  VIEWS TECHNIQUE: Fluoroscopic spot image(s) were submitted for interpretation post-operatively. COMPARISON:  10/17/2015 FINDINGS: Three images demonstrate intra medullary nail and lag screw fixation of comminuted intertrochanteric fracture of the left hip. No interval fractures are identified. IMPRESSION: ORIF of the left hip. Electronically Signed   By: Norva Pavlov M.D.   On: 10/19/2015 12:33   Dg Hip Unilat With Pelvis 2-3 Views Left  10/17/2015  CLINICAL DATA:  Status post fall tonight with a left hip injury and pain. Initial encounter. EXAM: DG HIP (WITH OR WITHOUT PELVIS) 2-3V LEFT COMPARISON:  None. FINDINGS: The patient has an acute, comminuted left intertrochanteric fracture. No other acute bony or joint abnormality is identified. Remote healed right intertrochanteric fracture with fixation hardware in place is noted. Lower lumbar spondylosis is identified. IMPRESSION: Acute left intertrochanteric fracture. Electronically Signed   By: Drusilla Kanner M.D.   On: 10/17/2015 21:03     Medications:   . sodium chloride 75 mL/hr at 10/19/15 1235   . acetaminophen  1,000 mg Oral 4 times per day  . ceFAZolin      .  ceFAZolin (ANCEF) IV  2 g Intravenous Q6H  . clindamycin      . clindamycin (CLEOCIN) IV  600 mg Intravenous 3 times per day  . [START ON 10/20/2015] ferrous sulfate  325 mg Oral Q breakfast  . heparin subcutaneous  5,000 Units Subcutaneous 3 times per day  . senna  1 tablet Oral BID  . sodium chloride flush       sodium chloride, sodium chloride, acetaminophen **OR** acetaminophen, alteplase, alum & mag hydroxide-simeth, bisacodyl, heparin, HYDROcodone-acetaminophen, lidocaine (PF), lidocaine-prilocaine, magnesium hydroxide, menthol-cetylpyridinium **OR** phenol, metoCLOPramide **OR** metoCLOPramide (REGLAN) injection, morphine injection, ondansetron **OR** ondansetron (ZOFRAN) IV, pentafluoroprop-tetrafluoroeth, sodium phosphate, zolpidem  Assessment/ Plan:  82 y.o. male  with past medical history of ESRD on HD MWF, hypertension, anemia of CKD, SHPTH, LUE AVF who sustained left hip fracture and is s/p left IM nail 10/19/15.   1.  ESRD on HD MWF:  Patient due for HD tomorrow, orders to be prepared.  Will keep UF target conservative given surgery today.  2.  Anemia of CKD:  Start epogen 10000 units IV while in the hospital.  Minimal blood loss during surgery.  3.  SHPTH:  Phos 4.0 and acceptable, continue to monitor bone mineral metabolism.   4.  Left hip fracture s/p IM nail 10/19/15: tolerated procedure well, will likely need rehab.  Pain control with acetaminophen at this time.    LOS: 2 Daril Warga 2/9/20175:21 PM

## 2015-10-19 NOTE — Anesthesia Preprocedure Evaluation (Addendum)
Anesthesia Evaluation  Patient identified by MRN, date of birth, ID band Patient awake    Reviewed: Allergy & Precautions, NPO status , Patient's Chart, lab work & pertinent test results  History of Anesthesia Complications Negative for: history of anesthetic complications  Airway Mallampati: II       Dental  (+) Partial Upper   Pulmonary neg pulmonary ROS,           Cardiovascular hypertension, Pt. on medications      Neuro/Psych negative neurological ROS     GI/Hepatic negative GI ROS, Neg liver ROS,   Endo/Other  negative endocrine ROS  Renal/GU CRF and DialysisRenal disease     Musculoskeletal   Abdominal   Peds  Hematology negative hematology ROS (+)   Anesthesia Other Findings   Reproductive/Obstetrics                            Anesthesia Physical Anesthesia Plan  ASA: IV  Anesthesia Plan: General   Post-op Pain Management:    Induction: Intravenous  Airway Management Planned: Oral ETT and LMA  Additional Equipment:   Intra-op Plan:   Post-operative Plan:   Informed Consent: I have reviewed the patients History and Physical, chart, labs and discussed the procedure including the risks, benefits and alternatives for the proposed anesthesia with the patient or authorized representative who has indicated his/her understanding and acceptance.     Plan Discussed with:   Anesthesia Plan Comments:         Anesthesia Quick Evaluation

## 2015-10-19 NOTE — Progress Notes (Signed)
Clinical Child psychotherapist (CSW) was approached by patient's HPOA [place] in hall way. 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 Sue Lush requested for CSW to look into short term rehab at the [place]. CSW explained that patient has to be service connected and eligible for services. Sue Lush reported that she is also open SNF in [facility] as.   CSW contacted the [facility] and spoke with Bonita Quin. Per Bonita Quin patient is not service connected and is only eligible for short term rehab at the [place]. Per Bonita Quin the rehab admissions coordinator will have to review referral including PT and OT notes to see if they can meet his needs. CSW also made [facility] aware that patient is on dialysis and goes to an outpatient center in [place]. Per Bonita Quin patient would have to get his dialysis switched to the [facility] and they would have to look into available chair times. CSW faxed H&P and MD notes to [facility] today. CSW also faxed SNF referral to [facility]. CSW will continue to follow and assist as needed.   Jetta Lout, LCSW [phone]

## 2015-10-19 NOTE — Progress Notes (Signed)
Per Bonita Quin at the [facility] they cannot accept him for short term rehab because they have no dialysis chairs. CSW contacted patient's HPOA Sue Lush and made her aware of above. CSW also left list of bed offers in [facility] in patient's room for Sue Lush to review. CSW will continue to follow and assist as needed.   Jetta Lout, LCSW [phone]

## 2015-10-19 NOTE — H&P (Signed)
THE PATIENT WAS SEEN IN THE HOLDING AREA.  HISTORY, ALLERGIES, HOME MEDICATIONS AND OPERATIVE PROCEDURE WERE REVIEWED. RISKS AND BENEFITS OF SURGERY DISCUSSED WITH PATIENT AGAIN.  NO CHANGES FROM INITIAL HISTORY AND PHYSICAL NOTED.    

## 2015-10-19 NOTE — Anesthesia Procedure Notes (Signed)
Procedure Name: LMA Insertion Performed by: Mariena Meares Pre-anesthesia Checklist: Patient identified, Patient being monitored, Timeout performed, Emergency Drugs available and Suction available Patient Re-evaluated:Patient Re-evaluated prior to inductionOxygen Delivery Method: Circle system utilized Preoxygenation: Pre-oxygenation with 100% oxygen Intubation Type: IV induction Ventilation: Mask ventilation without difficulty LMA: LMA inserted LMA Size: 4.0 Tube type: Oral Number of attempts: 1 Placement Confirmation: positive ETCO2 and breath sounds checked- equal and bilateral Tube secured with: Tape Dental Injury: Teeth and Oropharynx as per pre-operative assessment      

## 2015-10-20 LAB — CBC
HCT: 21.7 % — ABNORMAL LOW (ref 40.0–52.0)
HCT: 21.7 % — ABNORMAL LOW (ref 40.0–52.0)
HEMOGLOBIN: 7 g/dL — AB (ref 13.0–18.0)
Hemoglobin: 7.1 g/dL — ABNORMAL LOW (ref 13.0–18.0)
MCH: 30 pg (ref 26.0–34.0)
MCH: 29.8 pg (ref 26.0–34.0)
MCHC: 32.5 g/dL (ref 32.0–36.0)
MCHC: 32.1 g/dL (ref 32.0–36.0)
MCV: 92.4 fL (ref 80.0–100.0)
MCV: 92.8 fL (ref 80.0–100.0)
PLATELETS: 109 10*3/uL — AB (ref 150–440)
PLATELETS: 116 10*3/uL — AB (ref 150–440)
RBC: 2.35 MIL/uL — ABNORMAL LOW (ref 4.40–5.90)
RBC: 2.34 MIL/uL — AB (ref 4.40–5.90)
RDW: 16.4 % — ABNORMAL HIGH (ref 11.5–14.5)
RDW: 17 % — ABNORMAL HIGH (ref 11.5–14.5)
WBC: 6.4 10*3/uL (ref 3.8–10.6)
WBC: 6.4 10*3/uL (ref 3.8–10.6)

## 2015-10-20 LAB — BASIC METABOLIC PANEL
Anion gap: 9 (ref 5–15)
BUN: 50 mg/dL — AB (ref 6–20)
CALCIUM: 7.4 mg/dL — AB (ref 8.9–10.3)
CHLORIDE: 96 mmol/L — AB (ref 101–111)
CO2: 30 mmol/L (ref 22–32)
CREATININE: 4.85 mg/dL — AB (ref 0.61–1.24)
GFR calc Af Amer: 11 mL/min — ABNORMAL LOW (ref >60)
GFR calc non Af Amer: 9 mL/min — ABNORMAL LOW (ref >60)
Glucose, Bld: 97 mg/dL (ref 65–99)
Potassium: 4.2 mmol/L (ref 3.5–5.1)
Sodium: 135 mmol/L (ref 135–145)

## 2015-10-20 LAB — TYPE AND SCREEN
ABO/RH(D): O NEG
Antibody Screen: NEGATIVE

## 2015-10-20 LAB — PREPARE RBC (CROSSMATCH)

## 2015-10-20 LAB — PARATHYROID HORMONE, INTACT (NO CA): PTH: 125 pg/mL — AB (ref 15–65)

## 2015-10-20 MED ORDER — HEPARIN SODIUM (PORCINE) 5000 UNIT/ML IJ SOLN: 5000.0000 [IU] | Freq: Three times a day (TID) | INTRAMUSCULAR | Status: AC

## 2015-10-20 MED ORDER — SODIUM CHLORIDE 0.9 % IV SOLN
Freq: Once | INTRAVENOUS | Status: AC
  Administered 2015-10-20: 16:00:00 via INTRAVENOUS

## 2015-10-20 MED ORDER — SODIUM CHLORIDE 0.9 % IV SOLN
Freq: Once | INTRAVENOUS | Status: AC
  Administered 2015-10-20: 11:00:00 via INTRAVENOUS

## 2015-10-20 MED ORDER — HYDROCODONE-ACETAMINOPHEN 5-325 MG PO TABS: 1.0000 | ORAL_TABLET | Freq: Four times a day (QID) | ORAL | Status: DC | PRN

## 2015-10-20 MED ORDER — FERROUS SULFATE 325 (65 FE) MG PO TABS: 325.0000 mg | ORAL_TABLET | Freq: Every day | ORAL | Status: AC

## 2015-10-20 MED ORDER — ACETAMINOPHEN 325 MG PO TABS: 650.0000 mg | ORAL_TABLET | Freq: Four times a day (QID) | ORAL | Status: AC | PRN

## 2015-10-20 MED ORDER — EPOETIN ALFA 10000 UNIT/ML IJ SOLN
10000.0000 [IU] | INTRAMUSCULAR | Status: DC
  Administered 2015-10-20 – 2015-10-23 (×2): 10000 [IU] via INTRAVENOUS
  Filled 2015-10-20 (×2): qty 1

## 2015-10-20 NOTE — Progress Notes (Signed)
Plan is for patient to D/C to [facility] Sunday 10/22/15. [employer] employee completed admissions paper work with patient's HPOA Sue Lush today. Clinical Child psychotherapist (CSW) sent D/C Summary and D/C Packet to [employer] via [employer] today. Patient will go to room 706. RN will call report to [facility]. Kim dialysis liaison will send D/C Summary to patient's outpatient dialysis center. Patient and HPOA Sue Lush are aware of above. CSW will continue to follow and assist as needed.   Jetta Lout, LCSW [phone]

## 2015-10-20 NOTE — Discharge Summary (Addendum)
[facility] - [place] at [facility]   PATIENT NAME: Walter [name]    MR#:  [id number]  DATE OF BIRTH:  29-Mar-1922  DATE OF ADMISSION:  10/17/2015 ADMITTING PHYSICIAN: Ihor Austin, MD  DATE OF DISCHARGE: 10/23/2015  PRIMARY CARE PHYSICIAN: Shelda Pal, MD    ADMISSION DIAGNOSIS:  Hip fracture, left, closed, initial encounter (HCC) [S72.002A]  DISCHARGE DIAGNOSIS:  Principal Problem:   Fall Active Problems:   Hip fracture (HCC)   SECONDARY DIAGNOSIS:   Past Medical History  Diagnosis Date  . Renal disorder   . Hypertension   . ESRD on dialysis [facility])     HOSPITAL COURSE:   82y/oM with PMH of ESRD on M-W-F hemodialysis, HTN, Anemia admitted after a fall from Assisted living facility and left hip Fracture  #1 Left Hip fracture- intertronchanteric hip fracture s/p ORIF on 10/19/15 - Appreciate orthopedics consult. -Continue pain management and also on DVT prophylaxis. -Physical therapy consulted- arranged for Rehab.  #2 end-stage renal disease on hemodialysis-on Mondays, Wednesdays and Fridays dialysis.  -Appreciate nephrology consult.  #3 Acute on chronic anemia of chronic disease due to end-stage renal disease- and post surgical  - 1 unit Tx today during dialysis during hospital course - epo with dialysis after. - Hb stable now.  #4 hypertension- Since blood pressure is low normal after surgery, will hold his antihypertensives including lisinopril, minoxidil and Procardia Metoprolol and terazosin will be restarted at discharge Monitor BP  #5 nausea vomiting- resolved, advanced diet  # 6 constipation   Stool softners and enema, had BM on 10/22/15 night.  Physical therapy recommended Rehab  DISCHARGE CONDITIONS:   Stable  CONSULTS OBTAINED:  Treatment Team:  Deeann Saint, MD [place] Cherylann Ratel, MD  DRUG ALLERGIES:  No Known Allergies  DISCHARGE MEDICATIONS:   Current Discharge Medication List    START taking these medications    Details  acetaminophen (TYLENOL) 325 MG tablet Take 2 tablets (650 mg total) by mouth every 6 (six) hours as needed for mild pain (or Fever >/= 101). Qty: 30 tablet, Refills: 0    ferrous sulfate 325 (65 FE) MG tablet Take 1 tablet (325 mg total) by mouth daily with breakfast. Qty: 30 tablet, Refills: 3    heparin 5000 UNIT/ML injection Inject 1 mL (5,000 Units total) into the skin every 8 (eight) hours. X 2 weeks Qty: 50 mL, Refills: 0    HYDROcodone-acetaminophen (NORCO/VICODIN) 5-325 MG tablet Take 1-2 tablets by mouth every 6 (six) hours as needed for moderate pain. Qty: 30 tablet, Refills: 0      CONTINUE these medications which have NOT CHANGED   Details  acidophilus (RISAQUAD) CAPS capsule Take 1 capsule by mouth daily.    Amino Acids-Protein Hydrolys (FEEDING SUPPLEMENT, PRO-STAT SUGAR FREE 64,) LIQD Take 30 mLs by mouth 3 (three) times daily with meals.    aspirin 81 MG chewable tablet Chew 81 mg by mouth daily.    calcium acetate (PHOSLO) 667 MG capsule Take 667 mg by mouth 3 (three) times daily with meals.     calcium carbonate (TUMS - DOSED IN MG ELEMENTAL CALCIUM) 500 MG chewable tablet Chew 1 tablet by mouth 3 (three) times daily.    folic acid (FOLVITE) 800 MCG tablet Take 800 mcg by mouth daily.    metoprolol (LOPRESSOR) 50 MG tablet Take 50 mg by mouth 2 (two) times daily.    Multiple Vitamins-Minerals (MULTIVITAMIN WITH MINERALS) tablet Take 1 tablet by mouth daily.    omeprazole (PRILOSEC) 40  MG capsule Take 40 mg by mouth daily.     terazosin (HYTRIN) 2 MG capsule Take 2 mg by mouth at bedtime.     vitamin B-12 (CYANOCOBALAMIN) 250 MCG tablet Take 500 mcg by mouth daily.      STOP taking these medications     lisinopril (PRINIVIL,ZESTRIL) 20 MG tablet      minoxidil (LONITEN) 2.5 MG tablet      NIFEdipine (PROCARDIA) 20 MG capsule         DISCHARGE INSTRUCTIONS:   1. PCP f/u in 2 weeks 2. Orthopedic follow up in 10 days - 2 weeks 3. Physical  Therapy 4. For dialysis next session per schedule  If you experience worsening of your admission symptoms, develop shortness of breath, life threatening emergency, suicidal or homicidal thoughts you must seek medical attention immediately by calling 911 or calling your MD immediately  if symptoms less severe.  You Must read complete instructions/literature along with all the possible adverse reactions/side effects for all the Medicines you take and that have been prescribed to you. Take any new Medicines after you have completely understood and accept all the possible adverse reactions/side effects.   Please note  You were cared for by a hospitalist during your hospital stay. If you have any questions about your discharge medications or the care you received while you were in the hospital after you are discharged, you can call the unit and asked to speak with the hospitalist on call if the hospitalist that took care of you is not available. Once you are discharged, your primary care physician will handle any further medical issues. Please note that NO REFILLS for any discharge medications will be authorized once you are discharged, as it is imperative that you return to your primary care physician (or establish a relationship with a primary care physician if you do not have one) for your aftercare needs so that they can reassess your need for medications and monitor your lab values.    Today   CHIEF COMPLAINT:   Chief Complaint  Patient presents with  . Fall     VITAL SIGNS:  Blood pressure 115/41, pulse 66, temperature 98.1 F (36.7 C), temperature source Oral, resp. rate 18, height 5\' 10"  (1.778 m), weight 77.2 kg (170 lb 3.1 oz), SpO2 100 %.  I/O:   Intake/Output Summary (Last 24 hours) at 10/20/15 1427 Last data filed at 10/20/15 1130  Gross per 24 hour  Intake   2045 ml  Output      0 ml  Net   2045 ml    PHYSICAL EXAMINATION:   Physical Exam  GENERAL: 82 y.o.-year-old  patient lying in the bed with no acute distress.  EYES: Pupils equal, round, reactive to light and accommodation. No scleral icterus. Extraocular muscles intact.  HEENT: Head atraumatic, normocephalic. Oropharynx and nasopharynx clear.  NECK: Supple, no jugular venous distention. No thyroid enlargement, no tenderness.  LUNGS: Normal breath sounds bilaterally, no wheezing, rales,rhonchi or crepitation. No use of accessory muscles of respiration. Decreased bibasilar breath sounds CARDIOVASCULAR: S1, S2 normal. No rubs, or gallops. 3/6 systolic murmur present ABDOMEN: Soft, nontender, nondistended. Bowel sounds present. No organomegaly or mass.  EXTREMITIES: No pedal edema, cyanosis, or clubbing. Left forearm AV fistula NEUROLOGIC: Cranial nerves II through XII are intact. Muscle strength 5/5 in all extremities except left leg due to hip pain. Sensation intact. Gait not checked.  PSYCHIATRIC: The patient is alert and oriented x 3.  SKIN: No obvious rash, lesion,  or ulcer.   DATA REVIEW:   CBC  Recent Labs Lab 10/20/15 1036  WBC 6.4  HGB 7.0*  HCT 21.7*  PLT 116*    Chemistries   Recent Labs Lab 10/20/15 0529  NA 135  K 4.2  CL 96*  CO2 30  GLUCOSE 97  BUN 50*  CREATININE 4.85*  CALCIUM 7.4*    Cardiac Enzymes No results for input(s): TROPONINI in the last 168 hours.  Microbiology Results  Results for orders placed or performed during the hospital encounter of 10/17/15  MRSA PCR Screening     Status: None   Collection Time: 10/18/15  5:15 AM  Result Value Ref Range Status   MRSA by PCR NEGATIVE NEGATIVE Final    Comment:        The GeneXpert MRSA Assay (FDA approved for NASAL specimens only), is one component of a comprehensive MRSA colonization surveillance program. It is not intended to diagnose MRSA infection nor to guide or monitor treatment for MRSA infections.     RADIOLOGY:  Dg Hip Operative Unilat With Pelvis Left  10/19/2015  CLINICAL DATA:   1 min 6 secs fluoro time EXAM: OPERATIVE LEFT HIP (WITH PELVIS IF PERFORMED) 3 VIEWS TECHNIQUE: Fluoroscopic spot image(s) were submitted for interpretation post-operatively. COMPARISON:  10/17/2015 FINDINGS: Three images demonstrate intra medullary nail and lag screw fixation of comminuted intertrochanteric fracture of the left hip. No interval fractures are identified. IMPRESSION: ORIF of the left hip. Electronically Signed   By: Norva Pavlov M.D.   On: 10/19/2015 12:33    EKG:   Orders placed or performed during the hospital encounter of 10/17/15  . ED EKG  . ED EKG      Management plans discussed with the patient, family and they are in agreement.  CODE STATUS:  Code Status History    Date Active Date Inactive Code Status Order ID Comments User Context   10/18/2015  1:59 PM 10/19/2015  1:05 PM DNR [id number]  Katharina Caper, MD Inpatient   10/18/2015  2:29 AM 10/18/2015  1:59 PM Full Code [id number]  Ihor Austin, MD Inpatient   10/17/2015 11:41 PM 10/18/2015  2:29 AM Full Code [id number]  Deeann Saint, MD ED   10/19/2014  5:22 PM 10/19/2014  8:28 PM Full Code [id number]  Coletta Memos, MD Inpatient    Questions for Most Recent Historical Code Status (Order [id number])    Question Answer Comment   In the event of cardiac or respiratory ARREST Do not call a "code blue"    In the event of cardiac or respiratory ARREST Do not perform Intubation, CPR, defibrillation or ACLS    In the event of cardiac or respiratory ARREST Use medication by any route, position, wound care, and other measures to relive pain and suffering. May use oxygen, suction and manual treatment of airway obstruction as needed for comfort.     Advance Directive Documentation        Most Recent Value   Type of Advance Directive  Healthcare Power of Attorney   Pre-existing out of facility DNR order (yellow form or pink MOST form)     "MOST" Form in Place?        TOTAL TIME TAKING CARE OF THIS PATIENT: 37  minutes.     KALISETTI,RADHIKA M.D on 10/20/2015 at 2:27 PM  Between 7am to 6pm - Pager - [phone]  After 6pm go to [email or web address] - password [email or web address] [facility]  [facility]  Office  [phone]  CC: Primary care  physician; Shelda Pal, MD

## 2015-10-20 NOTE — Progress Notes (Signed)
[facility]  ROUNDING NOTE   Subjective:  Patient currently denies hip pain when he remains still. Patient due for dialysis today. Overall seems to be progressing well.  Objective:  Vital signs in last 24 hours:  Temp:  [97.5 F (36.4 C)-98.7 F (37.1 C)] 97.9 F (36.6 C) (02/10 0907) Pulse Rate:  [59-66] 66 (02/10 0907) Resp:  [10-18] 18 (02/10 0907) BP: (110-147)/(30-48) 110/30 mmHg (02/10 0907) SpO2:  [97 %-100 %] 98 % (02/10 0907)  Weight change:  Filed Weights   10/17/15 2019 10/18/15 0122 10/18/15 1207  Weight: 74.844 kg (165 lb) 69.718 kg (153 lb 11.2 oz) 69.7 kg (153 lb 10.6 oz)    Intake/Output: I/O last 3 completed shifts: In: 1975 [P.O.:500; I.V.:1475] Out: 0    Intake/Output this shift:     Physical Exam: General: NAD, resting in bed  Head: Normocephalic, atraumatic. Moist oral mucosal membranes  Eyes: Anicteric  Neck: Supple, trachea midline  Lungs:  Clear to auscultation normal effort  Heart: S1S2 no rubs  Abdomen:  Soft, nontender, BS present   Extremities:  trace peripheral edema.  Neurologic: Nonfocal, moving all four extremities  Skin: No lesions  Access: LUE AVF    Basic Metabolic Panel:  Recent Labs Lab 10/17/15 2126 10/18/15 0500 10/19/15 0547 10/19/15 1400 10/20/15 0529  NA 136 134* 140  --  135  K 3.8 3.5  --   --  4.2  CL 96* 96*  --   --  96*  CO2 33* 29  --   --  30  GLUCOSE 110* 113*  --   --  97  BUN 53* 57*  --   --  50*  CREATININE 4.73* 4.92*  --  4.21* 4.85*  CALCIUM 8.3* 7.8*  --   --  7.4*  PHOS  --   --   --  4.1  --     Liver Function Tests:  Recent Labs Lab 10/17/15 2126  ALBUMIN 2.9*   No results for input(s): LIPASE, AMYLASE in the last 168 hours. No results for input(s): AMMONIA in the last 168 hours.  CBC:  Recent Labs Lab 10/17/15 2126 10/18/15 0500 10/19/15 0547 10/19/15 1400 10/20/15 0529  WBC 4.7 6.8 7.3 7.9 6.4  NEUTROABS 3.5  --   --   --   --   HGB 10.0* 8.7* 9.0* 8.5*  7.1*  HCT 30.9* 26.8* 27.7* 26.2* 21.7*  MCV 93.2 92.7 92.3 92.8 92.4  PLT 138* 140* 138* 122* 109*    Cardiac Enzymes: No results for input(s): CKTOTAL, CKMB, CKMBINDEX, TROPONINI in the last 168 hours.  BNP: Invalid input(s): POCBNP  CBG: No results for input(s): GLUCAP in the last 168 hours.  Microbiology: Results for orders placed or performed during the hospital encounter of 10/17/15  MRSA PCR Screening     Status: None   Collection Time: 10/18/15  5:15 AM  Result Value Ref Range Status   MRSA by PCR NEGATIVE NEGATIVE Final    Comment:        The GeneXpert MRSA Assay (FDA approved for NASAL specimens only), is one component of a comprehensive MRSA colonization surveillance program. It is not intended to diagnose MRSA infection nor to guide or monitor treatment for MRSA infections.     Coagulation Studies:  Recent Labs  10/17/15 2126 10/18/15 0500  LABPROT 15.0 16.2*  INR 1.16 1.29    Urinalysis: No results for input(s): COLORURINE, LABSPEC, PHURINE, GLUCOSEU, HGBUR, BILIRUBINUR, KETONESUR, PROTEINUR, UROBILINOGEN, NITRITE, LEUKOCYTESUR in  the last 72 hours.  Invalid input(s): APPERANCEUR    Imaging: Dg Hip Operative Unilat With Pelvis Left  10/19/2015  CLINICAL DATA:  1 min 6 secs fluoro time EXAM: OPERATIVE LEFT HIP (WITH PELVIS IF PERFORMED) 3 VIEWS TECHNIQUE: Fluoroscopic spot image(s) were submitted for interpretation post-operatively. COMPARISON:  10/17/2015 FINDINGS: Three images demonstrate intra medullary nail and lag screw fixation of comminuted intertrochanteric fracture of the left hip. No interval fractures are identified. IMPRESSION: ORIF of the left hip. Electronically Signed   By: Norva Pavlov M.D.   On: 10/19/2015 12:33     Medications:     . sodium chloride   Intravenous Once  . sodium chloride   Intravenous Once  . epoetin (EPOGEN/PROCRIT) injection  10,000 Units Intravenous Q M,W,F-HD  . ferrous sulfate  325 mg Oral Q breakfast   . heparin subcutaneous  5,000 Units Subcutaneous 3 times per day  . senna  1 tablet Oral BID   acetaminophen **OR** acetaminophen, alteplase, alum & mag hydroxide-simeth, bisacodyl, heparin, HYDROcodone-acetaminophen, lidocaine (PF), lidocaine-prilocaine, magnesium hydroxide, menthol-cetylpyridinium **OR** phenol, metoCLOPramide **OR** metoCLOPramide (REGLAN) injection, morphine injection, ondansetron **OR** ondansetron (ZOFRAN) IV, pentafluoroprop-tetrafluoroeth, sodium phosphate, zolpidem  Assessment/ Plan:  82 y.o. male with past medical history of ESRD on HD MWF, hypertension, anemia of CKD, SHPTH, LUE AVF who sustained left hip fracture and is s/p left IM nail 10/19/15.   1.  ESRD on HD MWF:  Patient due for hemodialysis today.  Ultrafiltration target 1 kg.  2.  Anemia of CKD:  Hemoglobin currently down to 7.1.  Patient to be transfused one unit PRBC.  Continue Epogen 10,000 units IV during dialysis.  3.  SHPTH:  Phosphorous acceptable.  Continue to monitor periodically.   4.  Left hip fracture s/p IM nail 10/19/15: tolerated procedure well, will likely need rehab.     LOS: 3 Jalesha Plotz 2/10/201710:29 AM

## 2015-10-20 NOTE — Progress Notes (Signed)
PT Cancellation Note  Patient Details Name: RIORDAN WALLE MRN: [id number] DOB: 1922-06-11   Cancelled Treatment:    Reason Eval/Treat Not Completed: Patient at procedure or test/unavailable Pt out of room for dialysis this AM, will try back later as time allows.   LARANCE RATLEDGE 10/20/2015, 12:10 PM

## 2015-10-20 NOTE — Progress Notes (Addendum)
Subjective: 1 Day Post-Op Procedure(s) (LRB): INTRAMEDULLARY (IM) NAIL INTERTROCHANTRIC (Left)    Patient at dialysis.  Family reports good night for him with minimal pain.  Plan for [facility] SNF this weekend  Objective:   VITALS:   Filed Vitals:   10/20/15 1130 10/20/15 1315  BP:    Pulse:    Temp: 98.3 F (36.8 C) 98.1 F (36.7 C)  Resp:      patient in dialysis.  Nursing reports no issues.  Dressings remained satisfactory  LABS  Recent Labs  10/19/15 1400 10/20/15 0529 10/20/15 1036  HGB 8.5* 7.1* 7.0*  HCT 26.2* 21.7* 21.7*  WBC 7.9 6.4 6.4  PLT 122* 109* 116*     Recent Labs  10/17/15 2126 10/18/15 0500 10/19/15 0547 10/19/15 1400 10/20/15 0529  NA 136 134* 140  --  135  K 3.8 3.5  --   --  4.2  BUN 53* 57*  --   --  50*  CREATININE 4.73* 4.92*  --  4.21* 4.85*  GLUCOSE 110* 113*  --   --  97     Recent Labs  10/17/15 2126 10/18/15 0500  INR 1.16 1.29     Assessment/Plan: 1 Day Post-Op Procedure(s) (LRB): INTRAMEDULLARY (IM) NAIL INTERTROCHANTRIC (Left)   Advance diet Up with therapy Discharge to SNF Recommend [facility]  BID for six weeks for DVT prophylaxis

## 2015-10-20 NOTE — Care Management (Signed)
Patient is from the [facility] and currently open to [facility].

## 2015-10-20 NOTE — Evaluation (Signed)
Physical Therapy Evaluation Patient Details Name: Walter [name] MRN: [id number] DOB: 04/27/1922 Today's Date: 10/20/2015   History of Present Illness  PT is a 82 y/o male who suffered a fall at his assisted living facility and suffered a broken L hip needing an ORIF.   Clinical Impression  Pt was at dialysis this AM and is fatigued and lethargic t/o much of the PT exam.  He shows good effort, but was very limited with attempts at standing (unable to get to upright despite max assist).  He is able to participate well with 10 minutes of supine bed exercises apart from PT exam, but is hesitant and pain limited.      Follow Up Recommendations SNF    Equipment Recommendations       Recommendations for Other Services       Precautions / Restrictions Precautions Precautions: Fall Restrictions Weight Bearing Restrictions: Yes LLE Weight Bearing: Partial weight bearing      Mobility  Bed Mobility Overal bed mobility: Needs Assistance Bed Mobility: Supine to Sit;Sit to Supine     Supine to sit: Max assist Sit to supine: Max assist   General bed mobility comments: Pt very weak and limited with bed mobiltiy acts  Transfers Overall transfer level: Needs assistance Equipment used: Rolling walker (2 wheeled) Transfers: Sit to/from Stand Sit to Stand: Total assist         General transfer comment: 2 attempts at standing.  Both were very limited and despite much assist and prep cuing he was unable to really assist with getting to standing  Ambulation/Gait                Stairs            Wheelchair Mobility    Modified Rankin (Stroke Patients Only)       Balance                                             Pertinent Vitals/Pain Pain Assessment: 0-10 Pain Score: 4     Home Living Family/patient expects to be discharged to:: Skilled nursing facility                      Prior Function Level of Independence: Needs assistance          Comments: Pt at assisted living facility and needing meals, etc done for him but he was able to walk to dinning hall, go the bathroom, etc     Hand Dominance        Extremity/Trunk Assessment   Upper Extremity Assessment: Generalized weakness (age appropriate deficits)           Lower Extremity Assessment: LLE deficits/detail RLE Deficits / Details: R LE grossly 3+ to 4-/5 LLE Deficits / Details: Pt shows good effort with L LE motions, but is very pain limited and has no AROM     Communication   Communication: No difficulties;HOH  Cognition Arousal/Alertness: Lethargic Behavior During Therapy: WFL for tasks assessed/performed Overall Cognitive Status: Within Functional Limits for tasks assessed                      General Comments      Exercises General Exercises - Lower Extremity Ankle Circles/Pumps: AROM;10 reps;Left Quad Sets: Strengthening;10 reps Gluteal Sets: Strengthening;10 reps Heel Slides: AAROM;10 reps Hip ABduction/ADduction: AAROM;10  reps      Assessment/Plan    PT Assessment Patient needs continued PT services  PT Diagnosis Difficulty walking;Generalized weakness;Acute pain   PT Problem List Decreased strength;Decreased activity tolerance;Decreased range of motion;Decreased balance;Decreased mobility;Decreased coordination;Decreased knowledge of use of DME;Decreased safety awareness;Pain  PT Treatment Interventions DME instruction;Gait training;Stair training;Functional mobility training;Therapeutic activities;Therapeutic exercise;Balance training;Neuromuscular re-education;Patient/family education   PT Goals (Current goals can be found in the Care Plan section) Acute Rehab PT Goals Patient Stated Goal: Get back to walking PT Goal Formulation: With patient Time For Goal Achievement: 11/03/15 Potential to Achieve Goals: Fair    Frequency BID   Barriers to discharge        Co-evaluation               End of Session  Equipment Utilized During Treatment: Gait belt Activity Tolerance: Patient limited by pain;Patient limited by lethargy Patient left: with call bell/phone within reach;with bed alarm set           Time: 1610-9604 PT Time Calculation (min) (ACUTE ONLY): 29 min   Charges:   PT Evaluation $PT Eval Moderate Complexity: 1 Procedure PT Treatments $Therapeutic Exercise: 8-22 mins   PT G Codes:       Walter [name], PT, DPT [phone]  Walter [name] 10/20/2015, 7:05 PM

## 2015-10-20 NOTE — Progress Notes (Signed)
[facility] - [place] at [facility]   PATIENT NAME: Walter [name]    MR#:  [id number]  DATE OF BIRTH:  01-Dec-1921  SUBJECTIVE:  CHIEF COMPLAINT:   Chief Complaint  Patient presents with  . Fall   - Left hip surgery, postoperative day 1 today. Hemoglobin dropped,  -for dialysis today. Will receive blood during dialysis.  REVIEW OF SYSTEMS:  Review of Systems  Constitutional: Positive for malaise/fatigue. Negative for fever and chills.  HENT: Negative for ear discharge, ear pain and nosebleeds.   Respiratory: Negative for cough, shortness of breath and wheezing.   Cardiovascular: Negative for chest pain, palpitations and leg swelling.  Gastrointestinal: Negative for nausea, vomiting, abdominal pain, diarrhea and constipation.  Genitourinary: Negative for dysuria and frequency.  Musculoskeletal: Positive for joint pain. Negative for myalgias.  Neurological: Negative for dizziness, sensory change, speech change, focal weakness, seizures and headaches.    DRUG ALLERGIES:  No Known Allergies  VITALS:  Blood pressure 115/41, pulse 66, temperature 98.1 F (36.7 C), temperature source Oral, resp. rate 18, height  (1.778 m), weight 77.2 kg (170 lb 3.1 oz), SpO2 100 %.  PHYSICAL EXAMINATION:  Physical Exam  GENERAL:  82 y.o.-year-old patient lying in the bed with no acute distress.  EYES: Pupils equal, round, reactive to light and accommodation. No scleral icterus. Extraocular muscles intact.  HEENT: Head atraumatic, normocephalic. Oropharynx and nasopharynx clear.  NECK:  Supple, no jugular venous distention. No thyroid enlargement, no tenderness.  LUNGS: Normal breath sounds bilaterally, no wheezing, rales,rhonchi or crepitation. No use of accessory muscles of respiration. Decreased bibasilar breath sounds CARDIOVASCULAR: S1, S2 normal. No rubs, or gallops. 3/6 systolic murmur present ABDOMEN: Soft, nontender, nondistended. Bowel sounds present. No  organomegaly or mass.  EXTREMITIES: No pedal edema, cyanosis, or clubbing. Left forearm AV fistula NEUROLOGIC: Cranial nerves II through XII are intact. Muscle strength 5/5 in all extremities except left leg due to hip pain. Sensation intact. Gait not checked.  PSYCHIATRIC: The patient is alert and oriented x 3.  SKIN: No obvious rash, lesion, or ulcer.    LABORATORY PANEL:   CBC  Recent Labs Lab 10/20/15 1036  WBC 6.4  HGB 7.0*  HCT 21.7*  PLT 116*   ------------------------------------------------------------------------------------------------------------------  Chemistries   Recent Labs Lab 10/20/15 0529  NA 135  K 4.2  CL 96*  CO2 30  GLUCOSE 97  BUN 50*  CREATININE 4.85*  CALCIUM 7.4*   ------------------------------------------------------------------------------------------------------------------  Cardiac Enzymes No results for input(s): TROPONINI in the last 168 hours. ------------------------------------------------------------------------------------------------------------------  RADIOLOGY:  Dg Hip Operative Unilat With Pelvis Left  10/19/2015  CLINICAL DATA:  1 min 6 secs fluoro time EXAM: OPERATIVE LEFT HIP (WITH PELVIS IF PERFORMED) 3 VIEWS TECHNIQUE: Fluoroscopic spot image(s) were submitted for interpretation post-operatively. COMPARISON:  10/17/2015 FINDINGS: Three images demonstrate intra medullary nail and lag screw fixation of comminuted intertrochanteric fracture of the left hip. No interval fractures are identified. IMPRESSION: ORIF of the left hip. Electronically Signed   By: Norva Pavlov M.D.   On: 10/19/2015 12:33    EKG:   Orders placed or performed during the hospital encounter of 10/17/15  . ED EKG  . ED EKG    ASSESSMENT AND PLAN:   82y/oM with PMH of ESRD on M-W-F hemodialysis, HTN, Anemia admitted after a fall from Assisted living facility and left hip Fracture  #1 Left Hip fracture- intertronchanteric hip fracture s/p ORIF  POD #1 today - Appreciate orthopedics. -Continue pain management and  also on DVT prophylaxis. -Physical therapy consulted  #2 end-stage renal disease on hemodialysis-on Mondays, Wednesdays and Fridays dialysis.  For dialysis today -Appreciate nephrology consult.  #3 Acute on chronic anemia of chronic disease due to end-stage renal disease- and post surgical today - 1 unit Tx today during dialysis - epo with dialysis after.  #4 hypertension- Since blood pressure is low normal after surgery, will hold his antihypertensives including lisinopril, Terazosin, metoprolol and Procardia  #5 nausea vomiting- resolved, advance diet  #6 DVT prophylaxis-started on subcutaneous heparin   Physical therapy consulted. For discharge this weekend    All the records are reviewed and case discussed with Care Management/Social Workerr. Management plans discussed with the patient, family and they are in agreement.  CODE STATUS: Full Code  TOTAL TIME TAKING CARE OF THIS PATIENT: 37 minutes.   POSSIBLE D/C IN 2 DAYS, DEPENDING ON CLINICAL CONDITION.   Ranee Peasley M.D on 10/20/2015 at 2:22 PM  Between 7am to 6pm - Pager - [phone]  After 6pm go to [email or web address] - password [email or web address]  [place] [facility]  Office  [phone]  CC: Primary care physician; Shelda Pal, MD

## 2015-10-21 LAB — TYPE AND SCREEN
ABO/RH(D): O NEG
Antibody Screen: NEGATIVE
UNIT DIVISION: 0
Unit division: 0

## 2015-10-21 LAB — BASIC METABOLIC PANEL
Anion gap: 7 (ref 5–15)
BUN: 25 mg/dL — ABNORMAL HIGH (ref 6–20)
CO2: 32 mmol/L (ref 22–32)
CREATININE: 3.02 mg/dL — AB (ref 0.61–1.24)
Calcium: 7.3 mg/dL — ABNORMAL LOW (ref 8.9–10.3)
Chloride: 97 mmol/L — ABNORMAL LOW (ref 101–111)
GFR calc non Af Amer: 17 mL/min — ABNORMAL LOW (ref >60)
GFR, EST AFRICAN AMERICAN: 19 mL/min — AB (ref >60)
GLUCOSE: 104 mg/dL — AB (ref 65–99)
Potassium: 3.4 mmol/L — ABNORMAL LOW (ref 3.5–5.1)
Sodium: 136 mmol/L (ref 135–145)

## 2015-10-21 LAB — CBC
HEMATOCRIT: 27.1 % — AB (ref 40.0–52.0)
Hemoglobin: 9.1 g/dL — ABNORMAL LOW (ref 13.0–18.0)
MCH: 30.1 pg (ref 26.0–34.0)
MCHC: 33.6 g/dL (ref 32.0–36.0)
MCV: 89.7 fL (ref 80.0–100.0)
Platelets: 115 10*3/uL — ABNORMAL LOW (ref 150–440)
RBC: 3.02 MIL/uL — ABNORMAL LOW (ref 4.40–5.90)
RDW: 17.5 % — AB (ref 11.5–14.5)
WBC: 7.5 10*3/uL (ref 3.8–10.6)

## 2015-10-21 MED ORDER — SENNOSIDES-DOCUSATE SODIUM 8.6-50 MG PO TABS
2.0000 | ORAL_TABLET | Freq: Two times a day (BID) | ORAL | Status: DC
  Administered 2015-10-21 – 2015-10-22 (×3): 2 via ORAL
  Filled 2015-10-21 (×3): qty 2

## 2015-10-21 MED ORDER — METOPROLOL TARTRATE 25 MG PO TABS
25.0000 mg | ORAL_TABLET | Freq: Two times a day (BID) | ORAL | Status: DC
  Administered 2015-10-21 – 2015-10-23 (×4): 25 mg via ORAL
  Filled 2015-10-21 (×5): qty 1

## 2015-10-21 MED ORDER — MINERAL OIL RE ENEM: 1.0000 | ENEMA | Freq: Once | RECTAL | Status: DC

## 2015-10-21 MED ORDER — LISINOPRIL 20 MG PO TABS
20.0000 mg | ORAL_TABLET | Freq: Every day | ORAL | Status: DC
  Administered 2015-10-21 – 2015-10-23 (×3): 20 mg via ORAL
  Filled 2015-10-21 (×3): qty 1

## 2015-10-21 MED ORDER — LACTULOSE 10 GM/15ML PO SOLN
20.0000 g | Freq: Every day | ORAL | Status: DC | PRN
  Administered 2015-10-21 – 2015-10-22 (×2): 20 g via ORAL
  Filled 2015-10-21 (×2): qty 30

## 2015-10-21 NOTE — Progress Notes (Signed)
[facility]  ROUNDING NOTE   Subjective:  Patient undergoing physical therapy today. He had hemodialysis yesterday and tolerated this well. Possible transition to a rehabilitation facility tomorrow.  Objective:  Vital signs in last 24 hours:  Temp:  [97.6 F (36.4 C)-99.7 F (37.6 C)] 98.4 F (36.9 C) (02/11 0522) Pulse Rate:  [63-84] 71 (02/11 0522) Resp:  [16-20] 16 (02/11 0522) BP: (115-155)/(38-50) 153/46 mmHg (02/11 0522) SpO2:  [98 %-100 %] 100 % (02/11 0522) Weight:  [76.4 kg (168 lb 6.9 oz)-77.2 kg (170 lb 3.1 oz)] 76.4 kg (168 lb 6.9 oz) (02/10 1430)  Weight change:  Filed Weights   10/18/15 1207 10/20/15 1040 10/20/15 1430  Weight: 69.7 kg (153 lb 10.6 oz) 77.2 kg (170 lb 3.1 oz) 76.4 kg (168 lb 6.9 oz)    Intake/Output: I/O last 3 completed shifts: In: 1845 [P.O.:120; I.V.:1135; Blood:590] Out: 1300 [Other:1300]   Intake/Output this shift:     Physical Exam: General: NAD, resting in bed  Head: Normocephalic, atraumatic. Moist oral mucosal membranes  Eyes: Anicteric  Neck: Supple, trachea midline  Lungs:  Clear to auscultation normal effort  Heart: S1S2 no rubs  Abdomen:  Soft, nontender, BS present   Extremities:  trace peripheral edema.  Neurologic: Nonfocal, moving all four extremities  Skin: No lesions  Access: LUE AVF    Basic Metabolic Panel:  Recent Labs Lab 10/17/15 2126 10/18/15 0500 10/19/15 0547 10/19/15 1400 10/20/15 0529 10/21/15 0113  NA 136 134* 140  --  135 136  K 3.8 3.5  --   --  4.2 3.4*  CL 96* 96*  --   --  96* 97*  CO2 33* 29  --   --  30 32  GLUCOSE 110* 113*  --   --  97 104*  BUN 53* 57*  --   --  50* 25*  CREATININE 4.73* 4.92*  --  4.21* 4.85* 3.02*  CALCIUM 8.3* 7.8*  --   --  7.4* 7.3*  PHOS  --   --   --  4.1  --   --     Liver Function Tests:  Recent Labs Lab 10/17/15 2126  ALBUMIN 2.9*   No results for input(s): LIPASE, AMYLASE in the last 168 hours. No results for input(s): AMMONIA in  the last 168 hours.  CBC:  Recent Labs Lab 10/17/15 2126  10/19/15 0547 10/19/15 1400 10/20/15 0529 10/20/15 1036 10/21/15 0113  WBC 4.7  < > 7.3 7.9 6.4 6.4 7.5  NEUTROABS 3.5  --   --   --   --   --   --   HGB 10.0*  < > 9.0* 8.5* 7.1* 7.0* 9.1*  HCT 30.9*  < > 27.7* 26.2* 21.7* 21.7* 27.1*  MCV 93.2  < > 92.3 92.8 92.4 92.8 89.7  PLT 138*  < > 138* 122* 109* 116* 115*  < > = values in this interval not displayed.  Cardiac Enzymes: No results for input(s): CKTOTAL, CKMB, CKMBINDEX, TROPONINI in the last 168 hours.  BNP: Invalid input(s): POCBNP  CBG: No results for input(s): GLUCAP in the last 168 hours.  Microbiology: Results for orders placed or performed during the hospital encounter of 10/17/15  MRSA PCR Screening     Status: None   Collection Time: 10/18/15  5:15 AM  Result Value Ref Range Status   MRSA by PCR NEGATIVE NEGATIVE Final    Comment:        The GeneXpert MRSA Assay (FDA  approved for NASAL specimens only), is one component of a comprehensive MRSA colonization surveillance program. It is not intended to diagnose MRSA infection nor to guide or monitor treatment for MRSA infections.     Coagulation Studies: No results for input(s): LABPROT, INR in the last 72 hours.  Urinalysis: No results for input(s): COLORURINE, LABSPEC, PHURINE, GLUCOSEU, HGBUR, BILIRUBINUR, KETONESUR, PROTEINUR, UROBILINOGEN, NITRITE, LEUKOCYTESUR in the last 72 hours.  Invalid input(s): APPERANCEUR    Imaging: Dg Hip Operative Unilat With Pelvis Left  10/19/2015  CLINICAL DATA:  1 min 6 secs fluoro time EXAM: OPERATIVE LEFT HIP (WITH PELVIS IF PERFORMED) 3 VIEWS TECHNIQUE: Fluoroscopic spot image(s) were submitted for interpretation post-operatively. COMPARISON:  10/17/2015 FINDINGS: Three images demonstrate intra medullary nail and lag screw fixation of comminuted intertrochanteric fracture of the left hip. No interval fractures are identified. IMPRESSION: ORIF of the  left hip. Electronically Signed   By: Norva Pavlov M.D.   On: 10/19/2015 12:33     Medications:     . epoetin (EPOGEN/PROCRIT) injection  10,000 Units Intravenous Q M,W,F-HD  . ferrous sulfate  325 mg Oral Q breakfast  . heparin subcutaneous  5,000 Units Subcutaneous 3 times per day  . senna  1 tablet Oral BID   acetaminophen **OR** acetaminophen, alteplase, alum & mag hydroxide-simeth, bisacodyl, heparin, HYDROcodone-acetaminophen, lidocaine (PF), lidocaine-prilocaine, magnesium hydroxide, menthol-cetylpyridinium **OR** phenol, metoCLOPramide **OR** metoCLOPramide (REGLAN) injection, morphine injection, ondansetron **OR** ondansetron (ZOFRAN) IV, pentafluoroprop-tetrafluoroeth, sodium phosphate, zolpidem  Assessment/ Plan:  82 y.o. male with past medical history of ESRD on HD MWF, hypertension, anemia of CKD, SHPTH, LUE AVF who sustained left hip fracture and is s/p left IM nail 10/19/15.   1.  ESRD on HD MWF: Patient completed hemodialysis yesterday. No acute indication for dialysis today.  2.  Anemia of CKD:  Patient received 2 units PRBC transfusion yesterday. Continue Epogen 10,000 units IV with dialysis as well..  3.  SHPTH:  Patient not on binders therapy. Phosphorous under good control. Continue to periodically monitor.  4.  Left hip fracture s/p IM nail 10/19/15: tolerated procedure well, will transition to rehabilitation..     LOS: 4 Walter [name] 2/11/20179:51 AM

## 2015-10-21 NOTE — Progress Notes (Signed)
Administered fleets enema this evening. Previously administered suppository, lactulose, and stool softeners with no effect. MD notified, enema ordered. Will continue to monitor.

## 2015-10-21 NOTE — Progress Notes (Signed)
Physical Therapy Treatment Patient Details Name: Walter [name] MRN: [id number] DOB: 1922-07-28 Today's Date: 10/21/2015    History of Present Illness Pt is a 82 y/o male who suffered a fall at his assisted living facility and suffered a broken L hip needing an ORIF.     PT Comments    Pt is able to show a little more effort/AROM with exercises but generally remains very weak and limited and needs a lot of cuing and AAROM to complete them.  He continues to struggle with standing attempts with even max/total assist.  He did not have any knee buckling this afternoon because he was unable to even get upright enough (specifically getting R foot underneath him) to be in a position to actually bear weight through the LEs.  Pt is very weak and low energy and though she clearly is showing effort with some acts he is greatly limited.  Stand-pivot transfer bed to recliner with max assist.   Follow Up Recommendations  SNF     Equipment Recommendations       Recommendations for Other Services       Precautions / Restrictions Precautions Precautions: Fall Restrictions LLE Weight Bearing: Partial weight bearing    Mobility  Bed Mobility Overal bed mobility: Needs Assistance Bed Mobility: Supine to Sit     Supine to sit: Mod assist     General bed mobility comments: Pt able to assist a little more with getting to EOB and shows ability to maintain sitting balance, he shows good effort but is clearly very weak.  Transfers Overall transfer level: Needs assistance Equipment used: Rolling walker (2 wheeled) Transfers: Sit to/from [employer] Sit to Stand: Total assist         General transfer comment: again 2 attempts with heavy cuing for appropriate use of UEs and R LE to push, but still very weak and unsteady and frankly unable to use his good leg appropriate to even give him a chance at suscessful standing.  Pt struggles to get weight forward and keep R LE engaged with  appropriate positioning and use  Ambulation/Gait             General Gait Details: pt unable to stand/ambulation   Stairs            Wheelchair Mobility    Modified Rankin (Stroke Patients Only)       Balance                                    Cognition Arousal/Alertness: Lethargic Behavior During Therapy: WFL for tasks assessed/performed Overall Cognitive Status: Within Functional Limits for tasks assessed                      Exercises General Exercises - Lower Extremity Ankle Circles/Pumps: AROM;10 reps;Left Quad Sets: Strengthening;10 reps Gluteal Sets: Strengthening;10 reps Short Arc Quad: AAROM;AROM;10 reps Heel Slides: AAROM;10 reps Hip ABduction/ADduction: AAROM;10 reps    General Comments        Pertinent Vitals/Pain Pain Assessment:  (reports almost no pain at rest, increases with activity)    Home Living                      Prior Function            PT Goals (current goals can now be found in the care plan section) Progress towards PT  goals: Progressing toward goals    Frequency  BID    PT Plan Current plan remains appropriate    Co-evaluation             End of Session Equipment Utilized During Treatment: Gait belt Activity Tolerance: Patient limited by fatigue;Patient limited by pain       Time: 1610-9604 PT Time Calculation (min) (ACUTE ONLY): 44 min  Charges:  $Therapeutic Exercise: 23-37 mins $Therapeutic Activity: 8-22 mins                    G Codes:     Loran Senters, PT, DPT [phone]  Walter [name] 10/21/2015, 4:49 PM

## 2015-10-21 NOTE — Progress Notes (Signed)
[facility] - [place] at [facility]   PATIENT NAME: Walter [name]    MR#:  [id number]  DATE OF BIRTH:  [birth year]  SUBJECTIVE:  CHIEF COMPLAINT:   Chief Complaint  Patient presents with  . Fall   - Left hip surgery, postoperative day 2 today. Received 1unit prbc tx yesterday, Hb better today - dialysis yesterday - feels tired after working with physical therapy today  REVIEW OF SYSTEMS:  Review of Systems  Constitutional: Positive for malaise/fatigue. Negative for fever and chills.  HENT: Negative for ear discharge, ear pain and nosebleeds.   Respiratory: Negative for cough, shortness of breath and wheezing.   Cardiovascular: Negative for chest pain, palpitations and leg swelling.  Gastrointestinal: Negative for nausea, vomiting, abdominal pain, diarrhea and constipation.  Genitourinary: Negative for dysuria and frequency.  Musculoskeletal: Positive for joint pain. Negative for myalgias.  Neurological: Negative for dizziness, sensory change, speech change, focal weakness, seizures and headaches.    DRUG ALLERGIES:  No Known Allergies  VITALS:  Blood pressure 141/39, pulse 73, temperature 98.1 F (36.7 C), temperature source Oral, resp. rate 16, height  (1.778 m), weight 76.4 kg (168 lb 6.9 oz), SpO2 100 %.  PHYSICAL EXAMINATION:  Physical Exam  GENERAL:  82 y.o.-year-old patient lying in the bed with no acute distress.  EYES: Pupils equal, round, reactive to light and accommodation. No scleral icterus. Extraocular muscles intact.  HEENT: Head atraumatic, normocephalic. Oropharynx and nasopharynx clear.  NECK:  Supple, no jugular venous distention. No thyroid enlargement, no tenderness.  LUNGS: Normal breath sounds bilaterally, no wheezing, rales,rhonchi or crepitation. No use of accessory muscles of respiration. Decreased bibasilar breath sounds CARDIOVASCULAR: S1, S2 normal. No rubs, or gallops. 3/6 systolic murmur present ABDOMEN: Soft,  nontender, nondistended. Bowel sounds present. No organomegaly or mass.  EXTREMITIES: No pedal edema, cyanosis, or clubbing. Left forearm AV fistula NEUROLOGIC: Cranial nerves II through XII are intact. Muscle strength 5/5 in all extremities except left leg due to hip pain. Sensation intact. Gait not checked.  PSYCHIATRIC: The patient is alert and oriented x 3.  SKIN: No obvious rash, lesion, or ulcer.    LABORATORY PANEL:   CBC  Recent Labs Lab 10/21/15 0113  WBC 7.5  HGB 9.1*  HCT 27.1*  PLT 115*   ------------------------------------------------------------------------------------------------------------------  Chemistries   Recent Labs Lab 10/21/15 0113  NA 136  K 3.4*  CL 97*  CO2 32  GLUCOSE 104*  BUN 25*  CREATININE 3.02*  CALCIUM 7.3*   ------------------------------------------------------------------------------------------------------------------  Cardiac Enzymes No results for input(s): TROPONINI in the last 168 hours. ------------------------------------------------------------------------------------------------------------------  RADIOLOGY:  No results found.  EKG:   Orders placed or performed during the hospital encounter of 10/17/15  . ED EKG  . ED EKG    ASSESSMENT AND PLAN:   82y/oM with PMH of ESRD on M-W-F hemodialysis, HTN, Anemia admitted after a fall from Assisted living facility and left hip Fracture  #1 Left Hip fracture- intertronchanteric hip fracture s/p ORIF POD # 2 today - Appreciate orthopedics consult. -Continue pain management and also on DVT prophylaxis. -Physical therapy consulted  #2 end-stage renal disease on hemodialysis-on Mondays, Wednesdays and Fridays dialysis.  Next dialysis Monday per schedule -Appreciate nephrology consult.  #3 Acute on chronic anemia of chronic disease due to end-stage renal disease- and post surgical  - s/p 1 unit Tx and hb improved - epo with dialysis.  #4 hypertension- BP improving-  restart lisinopril and metoprolol. Continue to hold procardia and Terazosin,  #  5 nausea vomiting- resolved, advance diet  #6 DVT prophylaxis-started on subcutaneous heparin   Physical therapy consulted. Recommended rehab For discharge this weekend    All the records are reviewed and case discussed with Care Management/Social Workerr. Management plans discussed with the patient, family and they are in agreement.  CODE STATUS: Full Code  TOTAL TIME TAKING CARE OF THIS PATIENT: 37 minutes.   POSSIBLE D/C IN 2 DAYS, DEPENDING ON CLINICAL CONDITION.   Enid Baas M.D on 10/21/2015 at 11:56 AM  Between 7am to 6pm - Pager - [phone]  After 6pm go to [email or web address] - password [email or web address] [facility]  [place] [facility]  Office  [phone]  CC: Primary care physician; Shelda Pal, MD

## 2015-10-21 NOTE — Progress Notes (Signed)
Physical Therapy Treatment Patient Details Name: Walter [name] MRN: [id number] DOB: 04-20-1922 Today's Date: 10/21/2015    History of Present Illness Pt is a 82 y/o male who suffered a fall at his assisted living facility and suffered a broken L hip needing an ORIF.     PT Comments    Pt is able to get to EOB with heavy assist and is clearly uncomfortable with sitting on L side and leans heavily away from L.  He appears to listen and be motivated to try but is profoundly weak in both LEs (obviously more limited on the L secondary to fx) but ultimately he is not able to show much strength with exercises and is essentially unable to assist with getting to standing with weakness, knee buckling, and general inability to gather any force to push up through his LEs.   Follow Up Recommendations  SNF     Equipment Recommendations       Recommendations for Other Services       Precautions / Restrictions Precautions Precautions: Fall Restrictions Weight Bearing Restrictions: Yes LLE Weight Bearing: Partial weight bearing    Mobility  Bed Mobility Overal bed mobility: Needs Assistance Bed Mobility: Supine to Sit;Sit to Supine     Supine to sit: Max assist Sit to supine: Max assist   General bed mobility comments: Pt more awake and appears to help more today, but ultimately he remains very weak and limited  Transfers Overall transfer level: Needs assistance Equipment used: Rolling walker (2 wheeled) Transfers: Sit to/from Stand Sit to Stand: Total assist         General transfer comment: again did 2 attempts at standing with excessive cuing to use UEs appropriately and to push through his R heel.  Pt is unable to assist much at all with getting to standing and even with excessive assist to lift has some knee buckling bilaterally and is unable to get even close to fully upright.    Ambulation/Gait                 Stairs            Wheelchair Mobility    Modified  Rankin (Stroke Patients Only)       Balance                                    Cognition Arousal/Alertness: Lethargic Behavior During Therapy: WFL for tasks assessed/performed Overall Cognitive Status: Within Functional Limits for tasks assessed                      Exercises General Exercises - Lower Extremity Ankle Circles/Pumps: AROM;10 reps;Left Quad Sets: Strengthening;10 reps Gluteal Sets: Strengthening;10 reps Short Arc Quad: 10 reps;AROM Heel Slides: AAROM;10 reps Hip ABduction/ADduction: AAROM;10 reps    General Comments        Pertinent Vitals/Pain Pain Score: 6     Home Living                      Prior Function            PT Goals (current goals can now be found in the care plan section) Progress towards PT goals: Progressing toward goals (pt continues to be very limited)    Frequency  BID    PT Plan Current plan remains appropriate    Co-evaluation  End of Session Equipment Utilized During Treatment: Gait belt Activity Tolerance: Patient limited by pain;Patient limited by lethargy Patient left: with call bell/phone within reach;with bed alarm set     Time: 1610-9604 PT Time Calculation (min) (ACUTE ONLY): 25 min  Charges:  $Therapeutic Exercise: 8-22 mins $Therapeutic Activity: 8-22 mins                    G Codes:     Loran Senters, PT, DPT [phone]  ANTWAIN CALIENDO 10/21/2015, 10:59 AM

## 2015-10-22 LAB — CBC
HEMATOCRIT: 28 % — AB (ref 40.0–52.0)
HEMOGLOBIN: 9.3 g/dL — AB (ref 13.0–18.0)
MCH: 30.4 pg (ref 26.0–34.0)
MCHC: 33.1 g/dL (ref 32.0–36.0)
MCV: 91.9 fL (ref 80.0–100.0)
PLATELETS: 148 10*3/uL — AB (ref 150–440)
RBC: 3.05 MIL/uL — AB (ref 4.40–5.90)
RDW: 17.3 % — AB (ref 11.5–14.5)
WBC: 7.1 10*3/uL (ref 3.8–10.6)

## 2015-10-22 MED ORDER — NIFEDIPINE ER OSMOTIC RELEASE 30 MG PO TB24
30.0000 mg | ORAL_TABLET | Freq: Every day | ORAL | Status: DC
  Administered 2015-10-22 – 2015-10-23 (×2): 30 mg via ORAL
  Filled 2015-10-22 (×2): qty 1

## 2015-10-22 MED ORDER — TERAZOSIN HCL 2 MG PO CAPS
2.0000 mg | ORAL_CAPSULE | Freq: Every day | ORAL | Status: DC
  Administered 2015-10-22: 2 mg via ORAL
  Filled 2015-10-22: qty 1

## 2015-10-22 NOTE — Progress Notes (Signed)
Physical Therapy Treatment Patient Details Name: Walter [name] MRN: [id number] DOB: Walter [name], Walter [name] Today's Date: 10/22/2015    History of Present Illness Pt is a 82 y/o male who suffered a fall at his assisted living facility and suffered a broken L hip needing an ORIF.     PT Comments    Pt shows good effort with exercises, but is so weak he really is limited in how much he is actually doing vs AAROM from PT. He is able to get to EOB with much assist, but shows poor postural control in sitting and despite heavy cuing, set up and assist is simply unable to get feet underneath him and push to even get close to standing.  Dependent stand pivot transfer to get to recliner.   Follow Up Recommendations  SNF     Equipment Recommendations       Recommendations for Other Services       Precautions / Restrictions Precautions Precautions: Fall Restrictions Weight Bearing Restrictions: Yes LLE Weight Bearing: Partial weight bearing    Mobility  Bed Mobility Overal bed mobility: Needs Assistance Bed Mobility: Supine to Sit     Supine to sit: Mod assist;Max assist     General bed mobility comments: Pt again needing considerable assist and encouragement to get to EOB.  Pt struggles to maintain balance today leaning back and to the L (to unweight the R hip) in sitting.   Transfers Overall transfer level: Needs assistance Equipment used: Rolling walker (2 wheeled) Transfers: Sit to/from Stand Sit to Stand: Total assist         General transfer comment: Pt continues to be unable to keep either foot underneath him and shift weight forward to get up to standing. He is very weak, and despite showing some effort in getting up is ultimately very limited with what he is able to do. Pt not able to get to upright despite excessive assist  Ambulation/Gait                 Stairs            Wheelchair Mobility    Modified Rankin (Stroke Patients Only)       Balance                                    Cognition Arousal/Alertness: Lethargic Behavior During Therapy: Flat affect Overall Cognitive Status: Within Functional Limits for tasks assessed                      Exercises General Exercises - Lower Extremity Ankle Circles/Pumps: Both;10 reps;AROM;AAROM Quad Sets: Strengthening;10 reps;Both Gluteal Sets: Strengthening;10 reps;Both Short Arc Quad: AAROM;AROM;10 reps;Both Heel Slides: AAROM;10 reps;Both Hip ABduction/ADduction: AAROM;10 reps;Both    General Comments        Pertinent Vitals/Pain Pain Assessment: No/denies pain (no pain while sitting in chair and not moving)    Home Living Family/patient expects to be discharged to:: Skilled nursing facility                    Prior Function Level of Independence: Needs assistance    ADL's / Homemaking Assistance Needed: able to dress himself but needed help with bathing and had meals in dining hall which he was walking to but last few days before fall he was going in [place] per wife's report     PT Goals (current goals can now  be found in the care plan section) Acute Rehab PT Goals Patient Stated Goal: "get my strength back again" Progress towards PT goals: Not progressing toward goals - comment (Pt very weak, unable to get to standing)    Frequency  BID    PT Plan Current plan remains appropriate    Co-evaluation             End of Session Equipment Utilized During Treatment: Gait belt;Oxygen Activity Tolerance: Patient limited by fatigue;Patient limited by pain Patient left: with call bell/phone within reach;with chair alarm set     Time: 1610-9604 PT Time Calculation (min) (ACUTE ONLY): 27 min  Charges:  $Therapeutic Exercise: 8-22 mins $Therapeutic Activity: 8-22 mins                    G Codes:     Loran Senters, PT, DPT [phone]  Malachi Pro 10/22/2015, 11:21 AM

## 2015-10-22 NOTE — Progress Notes (Signed)
BP 128/38 HR 66. MD notified. No new orders.

## 2015-10-22 NOTE — Progress Notes (Signed)
[facility] [place] at [facility]   PATIENT NAME: Walter [name]    MR#:  [id number]  DATE OF BIRTH:  09-21-1921  SUBJECTIVE:  CHIEF COMPLAINT:   Chief Complaint  Patient presents with  . Fall   - Left hip surgery, postoperative day 3 today. Still has not had a bowel movement - Continues to be weak and fatigued with physical therapy - Anticipating discharge tomorrow after dialysis  REVIEW OF SYSTEMS:  Review of Systems  Constitutional: Positive for malaise/fatigue. Negative for fever and chills.  HENT: Negative for ear discharge, ear pain and nosebleeds.   Respiratory: Negative for cough, shortness of breath and wheezing.   Cardiovascular: Negative for chest pain, palpitations and leg swelling.  Gastrointestinal: Negative for nausea, vomiting, abdominal pain, diarrhea and constipation.  Genitourinary: Negative for dysuria and frequency.  Musculoskeletal: Positive for joint pain. Negative for myalgias.  Neurological: Negative for dizziness, sensory change, speech change, focal weakness, seizures and headaches.    DRUG ALLERGIES:  No Known Allergies  VITALS:  Blood pressure 169/53, pulse 72, temperature 99.3 F (37.4 C), temperature source Axillary, resp. rate 18, height  (1.778 m), weight 74.844 kg (165 lb), SpO2 99 %.  PHYSICAL EXAMINATION:  Physical Exam  GENERAL:  82 y.o.-year-old patient lying in the bed with no acute distress.  EYES: Pupils equal, round, reactive to light and accommodation. No scleral icterus. Extraocular muscles intact.  HEENT: Head atraumatic, normocephalic. Oropharynx and nasopharynx clear.  NECK:  Supple, no jugular venous distention. No thyroid enlargement, no tenderness.  LUNGS: Normal breath sounds bilaterally, no wheezing, rales,rhonchi or crepitation. No use of accessory muscles of respiration. Decreased bibasilar breath sounds CARDIOVASCULAR: S1, S2 normal. No rubs, or gallops. 3/6 systolic murmur present ABDOMEN:  Soft, nontender, nondistended. Bowel sounds present. No organomegaly or mass.  EXTREMITIES: No pedal edema, cyanosis, or clubbing. Left forearm AV fistula Left hip dressing in place with old blood in place under the dressing NEUROLOGIC: Cranial nerves II through XII are intact. Muscle strength 5/5 in all extremities except left leg due to hip pain. Sensation intact. Gait not checked.  PSYCHIATRIC: The patient is alert and oriented x 3.  SKIN: No obvious rash, lesion, or ulcer.    LABORATORY PANEL:   CBC  Recent Labs Lab 10/22/15 0448  WBC 7.1  HGB 9.3*  HCT 28.0*  PLT 148*   ------------------------------------------------------------------------------------------------------------------  Chemistries   Recent Labs Lab 10/21/15 0113  NA 136  K 3.4*  CL 97*  CO2 32  GLUCOSE 104*  BUN 25*  CREATININE 3.02*  CALCIUM 7.3*   ------------------------------------------------------------------------------------------------------------------  Cardiac Enzymes No results for input(s): TROPONINI in the last 168 hours. ------------------------------------------------------------------------------------------------------------------  RADIOLOGY:  No results found.  EKG:   Orders placed or performed during the hospital encounter of 10/17/15  . ED EKG  . ED EKG    ASSESSMENT AND PLAN:   82y/oM with PMH of ESRD on M-W-F hemodialysis, HTN, Anemia admitted after a fall from Assisted living facility and left hip Fracture  #1 Left Hip fracture- intertronchanteric hip fracture s/p ORIF POD # 3 today - Appreciate orthopedics consult. -Continue pain management and also on DVT prophylaxis. -Physical therapy consulted  #2 end-stage renal disease on hemodialysis-on Mondays, Wednesdays and Fridays dialysis.  Next dialysis Monday per schedule -Appreciate nephrology consult.  #3 Acute on chronic anemia of chronic disease due to end-stage renal disease- and post surgical  - s/p 1  unit Tx and hb improved after surgery - epo with  dialysis.  #4 hypertension- BP improving- on lisinopril and metoprolol. Restart his other home medications procardia and Terazosin as blood pressure is elevated now  #5 nausea vomiting- resolved, tolerating solid diet. According to her nephew patient has had trouble with occasional nausea and throwing up episodes even prior to the surgery.  #6 constipation-continue laxatives and enemas as needed. Still hasn't had a bowel movement. If no improvement, we'll get a KUB to rule out ileus  #6 DVT prophylaxis-started on subcutaneous heparin   Physical therapy consulted. Recommended rehab. Discharge to rehabilitation tomorrow    All the records are reviewed and case discussed with Care Management/Social Workerr. Management plans discussed with the patient, family and they are in agreement.  CODE STATUS: Full Code  TOTAL TIME TAKING CARE OF THIS PATIENT: 37 minutes.   POSSIBLE D/C TOMORROW, DEPENDING ON CLINICAL CONDITION.   Enid Baas M.D on 10/22/2015 at 8:05 AM  Between 7am to 6pm - Pager - [phone]  After 6pm go to [email or web address] - password [email or web address]  [place]  [facility]  Office  [phone]  CC: Primary care physician; Shelda Pal, MD

## 2015-10-22 NOTE — Evaluation (Signed)
Occupational Therapy Evaluation Patient Details Name: Walter [name] MRN: [id number] DOB: 23-Jul-1922 Today's Date: 10/22/2015    History of Present Illness Pt is a 82 y/o male who suffered a fall at his assisted living facility and suffered a broken L hip needing an ORIF.    Clinical Impression   Pt is 82 year old male s/p L hip ORIF who was living in ALF where he fell.  Pt was independent in all ADLs prior to surgery except for bathing and is receiving dialysis M, W, F Pt is currently limited in functional ADLs due to pain, decreased ROM, and fatigue.  Pt requires maximum assist for LB dressing and bathing skills due to pain and decreased AROM of LLE  and would benefit from continued skilled OT services for education in assistive devices, functional mobility, and education in recommendations for home modifications to increase safety and prevent falls.  Pt would benefit from SNF to continue rehabilitation as long as fatigue decreases and patient is able to participate more in ADLs.       Follow Up Recommendations  SNF (difficult to determine this session if pt is able to participate and make enough progress to go to  SNF or ALF)    Equipment Recommendations       Recommendations for Other Services       Precautions / Restrictions Precautions Precautions: Fall Restrictions Weight Bearing Restrictions: Yes LLE Weight Bearing: Partial weight bearing      Mobility Bed Mobility                  Transfers                      Balance                                            ADL Overall ADL's : Needs assistance/impaired                                       General ADL Comments: Pt requires set up for feeding and then able to feed self but needs cues to maintain attention to task due to lethargy for grooming skills.  Pt delcined LB dressing skills after attempting to use reacher for removing sock with max assist.  Pt's wife  indicated he will be going to [facility] assisted living but concerned that pt will not be able to paricipate well enough to go to ALF.  Will discuss further with care mgt and PT.      Vision     Perception     Praxis      Pertinent Vitals/Pain Pain Assessment: No/denies pain (no pain while sitting in chair and not moving)     Hand Dominance Right   Extremity/Trunk Assessment Upper Extremity Assessment Upper Extremity Assessment: Generalized weakness   Lower Extremity Assessment Lower Extremity Assessment: Defer to PT evaluation       Communication Communication Communication: HOH   Cognition Arousal/Alertness: Lethargic Behavior During Therapy: Flat affect Overall Cognitive Status: Within Functional Limits for tasks assessed                     General Comments       Exercises       Shoulder  Instructions      Home Living Family/patient expects to be discharged to:: Skilled nursing facility                                        Prior Functioning/Environment Level of Independence: Needs assistance    ADL's / Homemaking Assistance Needed: able to dress himself but needed help with bathing and had meals in dining hall which he was walking to but last few days before fall he was going in [place] per wife's report        OT Diagnosis: Generalized weakness;Acute pain   OT Problem List: Decreased strength;Decreased range of motion;Decreased activity tolerance;Decreased safety awareness;Pain   OT Treatment/Interventions: Self-care/ADL training;DME and/or AE instruction;Therapeutic activities;Patient/family education    OT Goals(Current goals can be found in the care plan section) Acute Rehab OT Goals Patient Stated Goal: "get my strength back again" OT Goal Formulation: With patient/family Time For Goal Achievement: 11/26/15 Potential to Achieve Goals: Good  OT Frequency: Min 1X/week   Barriers to D/C:    wife states he is going to  [facility] ALF but not sure if patient can participate in this level of care       Co-evaluation              End of Session    Activity Tolerance: Patient limited by lethargy;Patient limited by fatigue Patient left: in chair;with chair alarm set;with family/visitor present   Time: 1010-1035 OT Time Calculation (min): 25 min Charges:  OT General Charges $OT Visit: 1 Procedure OT Evaluation $OT Eval Moderate Complexity: 1 Procedure OT Treatments $Self Care/Home Management : 8-22 mins G-Codes:    Anastasya Jewell November 12, 2015, 10:58 AM  Susanne Borders, OTR/L ascom [phone]

## 2015-10-22 NOTE — Progress Notes (Signed)
Pt to d/c to [facility] on Monday. Plan to have hemodialysis early here prior to d/c. Pt continues with generalized weakness today, PT was able to get pt into chair with maximum assistance. Had Lg BM today after multiple interventions. Pain well controlled with hydrocodone (1 tab).

## 2015-10-22 NOTE — Care Management Note (Signed)
Case Management Note  Patient Details  Name: BERTHA EARWOOD MRN: [id number] Date of Birth: 1921-12-21  Subjective/Objective:      Still pending SNF placement.               Action/Plan:   Expected Discharge Date:                  Expected Discharge Plan:     In-House Referral:     Discharge planning Services     Post Acute Care Choice:    Choice offered to:     DME Arranged:    DME Agency:     HH Arranged:    HH Agency:     Status of Service:     Medicare Important Message Given:  Yes Date Medicare IM Given:    Medicare IM give by:    Date Additional Medicare IM Given:    Additional Medicare Important Message give by:     If discussed at Long Length of Stay Meetings, dates discussed:    Additional Comments:  Jourdin Connors A, RN 10/22/2015, 4:54 PM

## 2015-10-22 NOTE — Progress Notes (Signed)
[facility]  ROUNDING NOTE   Subjective:  Patient sitting up in chair this a.m. He is due for hemodialysis again tomorrow.  Objective:  Vital signs in last 24 hours:  Temp:  [98.2 F (36.8 C)-99.3 F (37.4 C)] 99.3 F (37.4 C) (02/12 0742) Pulse Rate:  [66-72] 72 (02/12 0742) Resp:  [16-18] 18 (02/12 0742) BP: (154-169)/(38-53) 169/53 mmHg (02/12 0742) SpO2:  [98 %-100 %] 99 % (02/12 0742) Weight:  [74.844 kg (165 lb)] 74.844 kg (165 lb) (02/12 0610)  Weight change: -2.356 kg (-5 lb 3.1 oz) Filed Weights   10/20/15 1040 10/20/15 1430 10/22/15 0610  Weight: 77.2 kg (170 lb 3.1 oz) 76.4 kg (168 lb 6.9 oz) 74.844 kg (165 lb)    Intake/Output: I/O last 3 completed shifts: In: 240 [P.O.:240] Out: 0    Intake/Output this shift:  Total I/O In: 240 [P.O.:240] Out: -   Physical Exam: General: NAD, resting in bed  Head: Normocephalic, atraumatic. Moist oral mucosal membranes  Eyes: Anicteric  Neck: Supple, trachea midline  Lungs:  Clear to auscultation normal effort  Heart: S1S2 no rubs  Abdomen:  Soft, nontender, BS present   Extremities:  trace peripheral edema.  Neurologic: Nonfocal, moving all four extremities  Skin: No lesions  Access: LUE AVF    Basic Metabolic Panel:  Recent Labs Lab 10/17/15 2126 10/18/15 0500 10/19/15 0547 10/19/15 1400 10/20/15 0529 10/21/15 0113  NA 136 134* 140  --  135 136  K 3.8 3.5  --   --  4.2 3.4*  CL 96* 96*  --   --  96* 97*  CO2 33* 29  --   --  30 32  GLUCOSE 110* 113*  --   --  97 104*  BUN 53* 57*  --   --  50* 25*  CREATININE 4.73* 4.92*  --  4.21* 4.85* 3.02*  CALCIUM 8.3* 7.8*  --   --  7.4* 7.3*  PHOS  --   --   --  4.1  --   --     Liver Function Tests:  Recent Labs Lab 10/17/15 2126  ALBUMIN 2.9*   No results for input(s): LIPASE, AMYLASE in the last 168 hours. No results for input(s): AMMONIA in the last 168 hours.  CBC:  Recent Labs Lab 10/17/15 2126  10/19/15 1400 10/20/15 0529  10/20/15 1036 10/21/15 0113 10/22/15 0448  WBC 4.7  < > 7.9 6.4 6.4 7.5 7.1  NEUTROABS 3.5  --   --   --   --   --   --   HGB 10.0*  < > 8.5* 7.1* 7.0* 9.1* 9.3*  HCT 30.9*  < > 26.2* 21.7* 21.7* 27.1* 28.0*  MCV 93.2  < > 92.8 92.4 92.8 89.7 91.9  PLT 138*  < > 122* 109* 116* 115* 148*  < > = values in this interval not displayed.  Cardiac Enzymes: No results for input(s): CKTOTAL, CKMB, CKMBINDEX, TROPONINI in the last 168 hours.  BNP: Invalid input(s): POCBNP  CBG: No results for input(s): GLUCAP in the last 168 hours.  Microbiology: Results for orders placed or performed during the hospital encounter of 10/17/15  MRSA PCR Screening     Status: None   Collection Time: 10/18/15  5:15 AM  Result Value Ref Range Status   MRSA by PCR NEGATIVE NEGATIVE Final    Comment:        The GeneXpert MRSA Assay (FDA approved for NASAL specimens only), is one component  of a comprehensive MRSA colonization surveillance program. It is not intended to diagnose MRSA infection nor to guide or monitor treatment for MRSA infections.     Coagulation Studies: No results for input(s): LABPROT, INR in the last 72 hours.  Urinalysis: No results for input(s): COLORURINE, LABSPEC, PHURINE, GLUCOSEU, HGBUR, BILIRUBINUR, KETONESUR, PROTEINUR, UROBILINOGEN, NITRITE, LEUKOCYTESUR in the last 72 hours.  Invalid input(s): APPERANCEUR    Imaging: No results found.   Medications:     . epoetin (EPOGEN/PROCRIT) injection  10,000 Units Intravenous Q M,W,F-HD  . ferrous sulfate  325 mg Oral Q breakfast  . heparin subcutaneous  5,000 Units Subcutaneous 3 times per day  . lisinopril  20 mg Oral Daily  . metoprolol tartrate  25 mg Oral BID  . mineral oil  1 enema Rectal Once  . NIFEdipine  30 mg Oral Daily  . senna-docusate  2 tablet Oral BID  . terazosin  2 mg Oral QHS   acetaminophen **OR** acetaminophen, alteplase, alum & mag hydroxide-simeth, bisacodyl, heparin,  HYDROcodone-acetaminophen, lactulose, lidocaine (PF), lidocaine-prilocaine, menthol-cetylpyridinium **OR** phenol, metoCLOPramide **OR** metoCLOPramide (REGLAN) injection, morphine injection, ondansetron **OR** ondansetron (ZOFRAN) IV, pentafluoroprop-tetrafluoroeth, sodium phosphate, zolpidem  Assessment/ Plan:  82 y.o. male with past medical history of ESRD on HD MWF, hypertension, anemia of CKD, SHPTH, LUE AVF who sustained left hip fracture and is s/p left IM nail 10/19/15.   1.  ESRD on HD MWF: Patient last had dialysis on Friday. No acute indication for dialysis today. We will plan for hemodialysis tomorrow.  2.  Anemia of CKD:  Patient received 2 units PRBC transfusion 10/20/15. Hemoglobin currently 9.3. Continue Epogen 10,000 units with dialysis.  3.  SHPTH:  Last phosphorus was within normal limits. Patient not on binders therapy. Continue to periodically monitor.  4.  Left hip fracture s/p IM nail 10/19/15: tolerated procedure well, will transition to rehabilitation..     LOS: 5 Ara Grandmaison 2/12/201711:25 AM

## 2015-10-23 LAB — BASIC METABOLIC PANEL
Anion gap: 8 (ref 5–15)
BUN: 46 mg/dL — AB (ref 6–20)
CALCIUM: 7.4 mg/dL — AB (ref 8.9–10.3)
CO2: 31 mmol/L (ref 22–32)
CREATININE: 5.11 mg/dL — AB (ref 0.61–1.24)
Chloride: 95 mmol/L — ABNORMAL LOW (ref 101–111)
GFR calc non Af Amer: 9 mL/min — ABNORMAL LOW (ref >60)
GFR, EST AFRICAN AMERICAN: 10 mL/min — AB (ref >60)
GLUCOSE: 91 mg/dL (ref 65–99)
Potassium: 3.3 mmol/L — ABNORMAL LOW (ref 3.5–5.1)
Sodium: 134 mmol/L — ABNORMAL LOW (ref 135–145)

## 2015-10-23 LAB — RENAL FUNCTION PANEL
ALBUMIN: 2 g/dL — AB (ref 3.5–5.0)
ANION GAP: 7 (ref 5–15)
BUN: 45 mg/dL — ABNORMAL HIGH (ref 6–20)
CHLORIDE: 95 mmol/L — AB (ref 101–111)
CO2: 31 mmol/L (ref 22–32)
Calcium: 7.4 mg/dL — ABNORMAL LOW (ref 8.9–10.3)
Creatinine, Ser: 5.09 mg/dL — ABNORMAL HIGH (ref 0.61–1.24)
GFR calc Af Amer: 10 mL/min — ABNORMAL LOW (ref >60)
GFR calc non Af Amer: 9 mL/min — ABNORMAL LOW (ref >60)
GLUCOSE: 89 mg/dL (ref 65–99)
PHOSPHORUS: 4.6 mg/dL (ref 2.5–4.6)
POTASSIUM: 3.3 mmol/L — AB (ref 3.5–5.1)
Sodium: 133 mmol/L — ABNORMAL LOW (ref 135–145)

## 2015-10-23 LAB — CBC
HEMATOCRIT: 27.4 % — AB (ref 40.0–52.0)
HEMOGLOBIN: 9.1 g/dL — AB (ref 13.0–18.0)
MCH: 30.2 pg (ref 26.0–34.0)
MCHC: 33.2 g/dL (ref 32.0–36.0)
MCV: 90.9 fL (ref 80.0–100.0)
Platelets: 157 10*3/uL (ref 150–440)
RBC: 3.02 MIL/uL — ABNORMAL LOW (ref 4.40–5.90)
RDW: 17.1 % — ABNORMAL HIGH (ref 11.5–14.5)
WBC: 7.7 10*3/uL (ref 3.8–10.6)

## 2015-10-23 MED ORDER — LISINOPRIL 20 MG PO TABS: 20.0000 mg | ORAL_TABLET | Freq: Every day | ORAL | Status: DC

## 2015-10-23 MED ORDER — LACTULOSE 10 GM/15ML PO SOLN: 20.0000 g | Freq: Every day | ORAL | Status: DC | PRN

## 2015-10-23 MED ORDER — SENNOSIDES-DOCUSATE SODIUM 8.6-50 MG PO TABS: 2.0000 | ORAL_TABLET | Freq: Two times a day (BID) | ORAL | Status: DC

## 2015-10-23 MED ORDER — NIFEDIPINE ER 30 MG PO TB24: 30.0000 mg | ORAL_TABLET | Freq: Every day | ORAL | Status: DC

## 2015-10-23 MED ORDER — HYDROCODONE-ACETAMINOPHEN 5-325 MG PO TABS: 1.0000 | ORAL_TABLET | Freq: Four times a day (QID) | ORAL | Status: DC | PRN

## 2015-10-23 NOTE — Care Management Important Message (Signed)
Important Message  Patient Details  Name: MALEAK BRAZZEL MRN: [id number] Date of Birth: [birth year]   Medicare Important Message Given:  Yes    Shalamar Plourde A, RN 10/23/2015, 11:27 AM

## 2015-10-23 NOTE — NC FL2 (Signed)
[place] MEDICAID FL2 LEVEL OF CARE SCREENING TOOL     IDENTIFICATION  Patient Name: Walter [name] Birthdate: Jan 15, 1922 Sex: male Admission Date (Current Location): 10/17/2015  [place] and [place] Number:  Chiropodist and Address:  [facility], [address] [place], [place] [address]      Provider Number: [id number]  Attending Physician Name and Address:  Altamese Dilling, MD  Relative Name and Phone Number:       Current Level of Care: Hospital Recommended Level of Care: Skilled Nursing Facility Prior Approval Number:    Date Approved/Denied:   PASRR Number:  ( [id number] )  Discharge Plan: SNF    Current Diagnoses: Patient Active Problem List   Diagnosis Date Noted  . Fall 10/17/2015  . Hip fracture (HCC) 10/17/2015  . ICH (intracerebral hemorrhage) (HCC) 10/19/2014    Orientation RESPIRATION BLADDER Height & Weight     Self, Time, Situation, Place  O2 (2 Liters Oxygen ) Continent Weight: 165 lb (74.844 kg) Height:   (177.8 cm)  BEHAVIORAL SYMPTOMS/MOOD NEUROLOGICAL BOWEL NUTRITION STATUS   (none )  (none )   Diet (Diet: 2 grams sodium. )  AMBULATORY STATUS COMMUNICATION OF NEEDS Skin   Extensive Assist Verbally Surgical wounds (Incision: Left Hip. )                       Personal Care Assistance Level of Assistance  Bathing, Feeding, Dressing Bathing Assistance: Limited assistance Feeding assistance: Independent Dressing Assistance: Limited assistance     Functional Limitations Info  Sight, Hearing, Speech Sight Info: Adequate Hearing Info: Impaired Speech Info: Adequate    SPECIAL CARE FACTORS FREQUENCY  PT (By licensed PT), OT (By licensed OT) (Dialysis ([facility] M,W,[facility] [address]) )     PT Frequency:  (5) OT Frequency:  (5)            Contractures      Additional Factors Info  Code Status Code Status Info:  (Full Code. )             Current Medications (10/23/2015):  This is the  current hospital active medication list Current Facility-Administered Medications  Medication Dose Route Frequency Provider Last Rate Last Dose  . acetaminophen (TYLENOL) tablet 650 mg  650 mg Oral Q6H PRN Deeann Saint, MD   650 mg at 10/23/15 0309   Or  . acetaminophen (TYLENOL) suppository 650 mg  650 mg Rectal Q6H PRN Deeann Saint, MD      . alteplase (CATHFLO ACTIVASE) injection 2 mg  2 mg Intracatheter Once PRN Munsoor Lateef, MD      . alum & mag hydroxide-simeth (MAALOX/MYLANTA) 200-200-20 MG/5ML suspension 30 mL  30 mL Oral Q4H PRN Deeann Saint, MD      . bisacodyl (DULCOLAX) suppository 10 mg  10 mg Rectal Daily PRN Deeann Saint, MD   10 mg at 10/21/15 1436  . epoetin alfa (EPOGEN,PROCRIT) injection 10,000 Units  10,000 Units Intravenous Q M,W,F-HD Munsoor Lateef, MD   10,000 Units at 10/20/15 1122  . ferrous sulfate tablet 325 mg  325 mg Oral Q breakfast Deeann Saint, MD   325 mg at 10/22/15 0900  . heparin injection 1,000 Units  1,000 Units Dialysis PRN Munsoor Lateef, MD      . heparin injection 5,000 Units  5,000 Units Subcutaneous 3 times per day Mady Haagensen, MD   5,000 Units at 10/23/15 0335  . HYDROcodone-acetaminophen (NORCO/VICODIN) 5-325 MG per tablet 1-2 tablet  1-2 tablet Oral Q6H PRN Deeann Saint, MD   1 tablet at 10/22/15 1850  . lactulose (CHRONULAC) 10 GM/15ML solution 20 g  20 g Oral Daily PRN Enid Baas, MD   20 g at 10/22/15 1434  . lidocaine (PF) (XYLOCAINE) 1 % injection 5 mL  5 mL Intradermal PRN Munsoor Lateef, MD      . lidocaine-prilocaine (EMLA) cream 1 application  1 application Topical PRN Munsoor Lateef, MD      . lisinopril (PRINIVIL,ZESTRIL) tablet 20 mg  20 mg Oral Daily Enid Baas, MD   20 mg at 10/22/15 0901  . menthol-cetylpyridinium (CEPACOL) lozenge 3 mg  1 lozenge Oral PRN Deeann Saint, MD       Or  . phenol (CHLORASEPTIC) mouth spray 1 spray  1 spray Mouth/Throat PRN Deeann Saint, MD      . metoCLOPramide (REGLAN) tablet  5-10 mg  5-10 mg Oral Q8H PRN Deeann Saint, MD       Or  . metoCLOPramide (REGLAN) injection 5-10 mg  5-10 mg Intravenous Q8H PRN Deeann Saint, MD      . metoprolol tartrate (LOPRESSOR) tablet 25 mg  25 mg Oral BID Enid Baas, MD   25 mg at 10/22/15 2149  . mineral oil enema 1 enema  1 enema Rectal Once Enid Baas, MD      . morphine 2 MG/ML injection 1 mg  1 mg Intravenous Q2H PRN Deeann Saint, MD      . NIFEdipine (PROCARDIA-XL/ADALAT-CC/NIFEDICAL-XL) 24 hr tablet 30 mg  30 mg Oral Daily Enid Baas, MD   30 mg at 10/22/15 0901  . ondansetron (ZOFRAN) tablet 4 mg  4 mg Oral Q6H PRN Deeann Saint, MD       Or  . ondansetron [facility]) injection 4 mg  4 mg Intravenous Q6H PRN Deeann Saint, MD   4 mg at 10/19/15 1458  . pentafluoroprop-tetrafluoroeth (GEBAUERS) aerosol 1 application  1 application Topical PRN Munsoor Lateef, MD      . senna-docusate (Senokot-S) tablet 2 tablet  2 tablet Oral BID Enid Baas, MD   2 tablet at 10/22/15 2148  . sodium phosphate (FLEET) 7-19 GM/118ML enema 1 enema  1 enema Rectal Once PRN Deeann Saint, MD      . terazosin (HYTRIN) capsule 2 mg  2 mg Oral QHS Enid Baas, MD   2 mg at 10/22/15 2147  . zolpidem (AMBIEN) tablet 5 mg  5 mg Oral QHS PRN Deeann Saint, MD         Discharge Medications: Please see discharge summary for a list of discharge medications.  Relevant Imaging Results:  Relevant Lab Results:   Additional Information  (SSN: [id number])  Haig Prophet, LCSW

## 2015-10-23 NOTE — Progress Notes (Signed)
Physical Therapy Treatment Patient Details Name: Walter [name] MRN: [id number] DOB: Aug 17, 1922 Today's Date: 10/23/2015    History of Present Illness Pt is a 82 y/o male who suffered a fall at his assisted living facility and suffered a broken L hip needing an ORIF.     PT Comments    Pt requires increased verbal and tactile cueing as well as assist to complete supine bed exercise and stay on task. Pt requires personal hygiene post exercises. Pt also to go to hemodialysis this morning before discharge to skilled nursing facility later today; therefore, out of bed transfer deferred. Plan for pt to be discharge later today to [facility].   Follow Up Recommendations  SNF     Equipment Recommendations       Recommendations for Other Services       Precautions / Restrictions Precautions Precautions: Fall Restrictions Weight Bearing Restrictions: Yes LLE Weight Bearing: Partial weight bearing    Mobility  Bed Mobility               General bed mobility comments: Not tested; pt to go to hemodialysis this morning before discharging. Pt also requires personal hygiene post exercises  Transfers                    Ambulation/Gait                 Stairs            Wheelchair Mobility    Modified Rankin (Stroke Patients Only)       Balance                                    Cognition Arousal/Alertness: Lethargic Behavior During Therapy: Flat affect Overall Cognitive Status: Difficult to assess                      Exercises General Exercises - Lower Extremity Ankle Circles/Pumps: AAROM;Both;20 reps;Supine Quad Sets: Strengthening;10 reps;Both;Supine Gluteal Sets: Other (comment) (attempted; difficulty comprehending technique) Short Arc Quad: AAROM;Both;15 reps;Supine Heel Slides: AAROM;Both;15 reps;Supine Hip ABduction/ADduction: AAROM;Both;15 reps;Supine    General Comments        Pertinent Vitals/Pain Pain  Assessment: No/denies pain    Home Living                      Prior Function            PT Goals (current goals can now be found in the care plan section) Progress towards PT goals: Progressing toward goals (slowly)    Frequency  BID    PT Plan Current plan remains appropriate    Co-evaluation             End of Session Equipment Utilized During Treatment: Oxygen Activity Tolerance: Patient tolerated treatment well;Patient limited by lethargy (requires increased verbal and tactile cueing to stay on task) Patient left: in bed;with call bell/phone within reach;with bed alarm set;with nursing/sitter in room     Time: 0927-0950 PT Time Calculation (min) (ACUTE ONLY): 23 min  Charges:  $Therapeutic Exercise: 23-37 mins                    G Codes:      Kristeen Miss 10/23/2015, 10:06 AM

## 2015-10-23 NOTE — Clinical Social Work Placement (Signed)
   CLINICAL SOCIAL WORK PLACEMENT  NOTE  Date:  10/23/2015  Patient Details  Name: Walter [name] MRN: [id number] Date of Birth: Feb 02, 1922  Clinical Social Work is seeking post-discharge placement for this patient at the Skilled  Nursing Facility level of care (*CSW will initial, date and re-position this form in  chart as items are completed):  Yes   Patient/family provided with [facility] Clinical Social Work Department's list of facilities offering this level of care within the geographic area requested by the patient (or if unable, by the patient's family).  Yes   Patient/family informed of their freedom to choose among providers that offer the needed level of care, that participate in Medicare, Medicaid or managed care program needed by the patient, have an available bed and are willing to accept the patient.  Yes   Patient/family informed of [facility] [place]'s ownership interest in [facility] and [facility], as well as of the fact that they are under no obligation to receive care at these facilities.  PASRR submitted to EDS on       PASRR number received on       Existing PASRR number confirmed on 10/18/15     FL2 transmitted to all facilities in geographic area requested by pt/family on 10/18/15     FL2 transmitted to all facilities within larger geographic area on       Patient informed that his/her managed care company has contracts with or will negotiate with certain facilities, including the following:        Yes   Patient/family informed of bed offers received.  Patient chooses bed at  [facility] )     Physician recommends and patient chooses bed at      Patient to be transferred to  [facility] ) on 10/23/15.  Patient to be transferred to facility by  [facility] )     Patient family notified on 10/23/15 of transfer.  Name of family member notified:   (Patient's friend HPOA Sue Lush is aware of D/C today. )     PHYSICIAN       Additional  Comment:    _______________________________________________ Haig Prophet, LCSW 10/23/2015, 11:33 AM

## 2015-10-23 NOTE — Progress Notes (Signed)
Report called to [place] at [facility]. Will call EMS when patient returns from dialysis.

## 2015-10-23 NOTE — Progress Notes (Signed)
EMS called

## 2015-10-23 NOTE — Progress Notes (Signed)
[facility]  ROUNDING NOTE   Subjective:   Seen and examined on hemodialysis. Tolerating treatment well. UF of 1 litre.   Objective:  Vital signs in last 24 hours:  Temp:  [97.8 F (36.6 C)-98.6 F (37 C)] 98.2 F (36.8 C) (02/13 0957) Pulse Rate:  [63-72] 71 (02/13 1100) Resp:  [16-20] 20 (02/13 1013) BP: (128-156)/(38-57) 153/57 mmHg (02/13 1013) SpO2:  [98 %-100 %] 98 % (02/13 1013)  Weight change:  Filed Weights   10/20/15 1040 10/20/15 1430 10/22/15 0610  Weight: 77.2 kg (170 lb 3.1 oz) 76.4 kg (168 lb 6.9 oz) 74.844 kg (165 lb)    Intake/Output: I/O last 3 completed shifts: In: 480 [P.O.:480] Out: 0    Intake/Output this shift:  Total I/O In: 240 [P.O.:240] Out: 0   Physical Exam: General: NAD, resting in bed  Head: Normocephalic, atraumatic. Moist oral mucosal membranes  Eyes: Anicteric  Neck: Supple, trachea midline  Lungs:  Clear to auscultation normal effort  Heart: S1S2 no rubs  Abdomen:  Soft, nontender, BS present   Extremities:  no peripheral edema.  Neurologic: Nonfocal, moving all four extremities  Skin: No lesions  Access: LUE AVF    Basic Metabolic Panel:  Recent Labs Lab 10/17/15 2126 10/18/15 0500 10/19/15 0547 10/19/15 1400 10/20/15 0529 10/21/15 0113 10/23/15 0455  NA 136 134* 140  --  135 136 133*  134*  K 3.8 3.5  --   --  4.2 3.4* 3.3*  3.3*  CL 96* 96*  --   --  96* 97* 95*  95*  CO2 33* 29  --   --  30 32 31  31  GLUCOSE 110* 113*  --   --  97 104* 89  91  BUN 53* 57*  --   --  50* 25* 45*  46*  CREATININE 4.73* 4.92*  --  4.21* 4.85* 3.02* 5.09*  5.11*  CALCIUM 8.3* 7.8*  --   --  7.4* 7.3* 7.4*  7.4*  PHOS  --   --   --  4.1  --   --  4.6    Liver Function Tests:  Recent Labs Lab 10/17/15 2126 10/23/15 0455  ALBUMIN 2.9* 2.0*   No results for input(s): LIPASE, AMYLASE in the last 168 hours. No results for input(s): AMMONIA in the last 168 hours.  CBC:  Recent Labs Lab 10/17/15 2126   10/20/15 0529 10/20/15 1036 10/21/15 0113 10/22/15 0448 10/23/15 0455  WBC 4.7  < > 6.4 6.4 7.5 7.1 7.7  NEUTROABS 3.5  --   --   --   --   --   --   HGB 10.0*  < > 7.1* 7.0* 9.1* 9.3* 9.1*  HCT 30.9*  < > 21.7* 21.7* 27.1* 28.0* 27.4*  MCV 93.2  < > 92.4 92.8 89.7 91.9 90.9  PLT 138*  < > 109* 116* 115* 148* 157  < > = values in this interval not displayed.  Cardiac Enzymes: No results for input(s): CKTOTAL, CKMB, CKMBINDEX, TROPONINI in the last 168 hours.  BNP: Invalid input(s): POCBNP  CBG: No results for input(s): GLUCAP in the last 168 hours.  Microbiology: Results for orders placed or performed during the hospital encounter of 10/17/15  MRSA PCR Screening     Status: None   Collection Time: 10/18/15  5:15 AM  Result Value Ref Range Status   MRSA by PCR NEGATIVE NEGATIVE Final    Comment:  The GeneXpert MRSA Assay (FDA approved for NASAL specimens only), is one component of a comprehensive MRSA colonization surveillance program. It is not intended to diagnose MRSA infection nor to guide or monitor treatment for MRSA infections.     Coagulation Studies: No results for input(s): LABPROT, INR in the last 72 hours.  Urinalysis: No results for input(s): COLORURINE, LABSPEC, PHURINE, GLUCOSEU, HGBUR, BILIRUBINUR, KETONESUR, PROTEINUR, UROBILINOGEN, NITRITE, LEUKOCYTESUR in the last 72 hours.  Invalid input(s): APPERANCEUR    Imaging: No results found.   Medications:     . epoetin (EPOGEN/PROCRIT) injection  10,000 Units Intravenous Q M,W,F-HD  . ferrous sulfate  325 mg Oral Q breakfast  . heparin subcutaneous  5,000 Units Subcutaneous 3 times per day  . lisinopril  20 mg Oral Daily  . metoprolol tartrate  25 mg Oral BID  . mineral oil  1 enema Rectal Once  . NIFEdipine  30 mg Oral Daily  . senna-docusate  2 tablet Oral BID  . terazosin  2 mg Oral QHS   acetaminophen **OR** acetaminophen, alteplase, alum & mag hydroxide-simeth, bisacodyl,  heparin, HYDROcodone-acetaminophen, lactulose, lidocaine (PF), lidocaine-prilocaine, menthol-cetylpyridinium **OR** phenol, metoCLOPramide **OR** metoCLOPramide (REGLAN) injection, morphine injection, ondansetron **OR** ondansetron (ZOFRAN) IV, pentafluoroprop-tetrafluoroeth, sodium phosphate, zolpidem  Assessment/ Plan:  82 y.o. male with past medical history of ESRD on HD MWF, hypertension, anemia of CKD, SHPTH, LUE AVF who sustained left hip fracture and is s/p left IM nail 10/19/15.   [facility] [address] MWF   1.  ESRD on HD MWF: seen and examined on hemodialysis. Tolerating procedure well. UF of 1 litre.  - Continue MWF schedule.   2.  Anemia of CKD:  Patient received 2 units PRBC transfusion 10/20/15. Hemoglobin 9.1 - Continue Epogen 10,000 units with dialysis.  3.  Secondary Hyperparathyroidism: phos at goal 4.6. PTH low at 125 - not currently on any binders or activated vitamin D.   4.  Left hip fracture s/p IM nail 10/19/15: tolerated procedure well, will transition to rehabilitation..     LOS: 6 Ninfa Giannelli 2/13/201711:23 AM

## 2015-10-23 NOTE — Progress Notes (Signed)
Patient is medically stable for D/C to [facility] today. Per [facility] admissions coordinator patient will go to room 706. RN will call report to RN on [facility]. Clinical Child psychotherapist (CSW) sent D/C Summary, FL2 and D/C Packet to [facility] today. Patient will have dialysis today at [facility] and D/C afterwards. [facility] dialysis liaison is aware of above. CSW contacted patient's HPOA Sue Lush and made her aware of above. Please reconsult if future social work needs arise. CSW signing off.   Jetta Lout, LCSW [phone]

## 2015-10-23 NOTE — Progress Notes (Signed)
Patient has been having loose stools. Dr Elisabeth Pigeon notified, states patient has been on stool softeners, no need to test for c diff.

## 2015-10-23 NOTE — Care Management (Signed)
I have notified Walter [name] with [facility] that patient is going to [facility] today. Case closed.

## 2015-10-27 ENCOUNTER — Encounter: Payer: Self-pay | Admitting: Adult Health

## 2015-10-27 ENCOUNTER — Non-Acute Institutional Stay (SKILLED_NURSING_FACILITY): Payer: Medicare Other | Admitting: Adult Health

## 2015-10-27 DIAGNOSIS — D689 Coagulation defect, unspecified: Secondary | ICD-10-CM | POA: Insufficient documentation

## 2015-10-27 DIAGNOSIS — N186 End stage renal disease: Secondary | ICD-10-CM | POA: Insufficient documentation

## 2015-10-27 DIAGNOSIS — Z992 Dependence on renal dialysis: Secondary | ICD-10-CM

## 2015-10-27 DIAGNOSIS — S72142A Displaced intertrochanteric fracture of left femur, initial encounter for closed fracture: Secondary | ICD-10-CM

## 2015-10-27 MED ORDER — HYDROCODONE-ACETAMINOPHEN 5-325 MG PO TABS: 1.0000 | ORAL_TABLET | Freq: Four times a day (QID) | ORAL | Status: AC | PRN

## 2015-10-27 NOTE — Progress Notes (Signed)
Facility: [facility]        No Known Allergies  Chief Complaint  Patient presents with  . Discharge Note    Discharge from SNF     HPI:  He had been hospitalized for a left hip fracture with nailing performed; he has ESRD on hemodialysis. He has decided for comfort care only; is going to stop his dialysis and desires to return back to [facility] assisted living with hospice care. He had been followed by hospice care at that facility. Per his RP they are awaiting a hospital bed and he will be transferred at that time.  Staff reports today that he has active bleeding from his hip incision line. He does not want to go to the hospital; he want to focus his care upon comfort only.    Past Medical History  Diagnosis Date  . Renal disorder   . Hypertension   . ESRD on dialysis [facility])     Past Surgical History  Procedure Laterality Date  . Av fistula placement    . Hip surgery    . Intramedullary (im) nail intertrochanteric Left 10/19/2015    Procedure: INTRAMEDULLARY (IM) NAIL INTERTROCHANTRIC;  Surgeon: Deeann Saint, MD;  Location: [facility] ORS;  Service: Orthopedics;  Laterality: Left;    VITAL SIGNS BP 190/68 mmHg  Pulse 70  Temp(Src) 97.2 F (36.2 C) (Oral)  Resp 20  Ht  (1.778 m)  Wt 153 lb (69.4 kg)  BMI 21.95 kg/m2  SpO2 92%  Patient's Medications  New Prescriptions   No medications on file  Previous Medications   ACETAMINOPHEN (TYLENOL) 325 MG TABLET    Take 2 tablets (650 mg total) by mouth every 6 (six) hours as needed for mild pain (or Fever >/= 101).   ACIDOPHILUS (RISAQUAD) CAPS CAPSULE    Take 1 capsule by mouth daily.   AMINO ACIDS-PROTEIN HYDROLYS (FEEDING SUPPLEMENT, PRO-STAT SUGAR FREE 64,) LIQD    Take 30 mLs by mouth 3 (three) times daily with meals.   ASPIRIN EC 81 MG TABLET    Take 81 mg by mouth daily.   CALCIUM ACETATE (PHOSLO) 667 MG CAPSULE    Take 667 mg by mouth 3 (three) times daily with meals.    CALCIUM CARBONATE (TUMS  - DOSED IN MG ELEMENTAL CALCIUM) 500 MG CHEWABLE TABLET    Chew 1 tablet by mouth 3 (three) times daily.   FERROUS SULFATE 325 (65 FE) MG TABLET    Take 1 tablet (325 mg total) by mouth daily with breakfast.   FOLIC ACID (FOLVITE) 800 MCG TABLET    Take 800 mcg by mouth daily.   HEPARIN 5000 UNIT/ML INJECTION    Inject 1 mL (5,000 Units total) into the skin every 8 (eight) hours. X 2 weeks   HYDROCODONE-ACETAMINOPHEN (NORCO/VICODIN) 5-325 MG TABLET    Take 1-2 tablets by mouth every 6 (six) hours as needed for moderate pain.   METOPROLOL (LOPRESSOR) 50 MG TABLET    Take 50 mg by mouth 2 (two) times daily.   MULTIPLE VITAMINS-MINERALS (MULTIVITAMIN WITH MINERALS) TABLET    Take 1 tablet by mouth daily.   OMEPRAZOLE (PRILOSEC) 40 MG CAPSULE    Take 40 mg by mouth daily.    TERAZOSIN (HYTRIN) 2 MG CAPSULE    Take 2 mg by mouth at bedtime.    VITAMIN B-12 (CYANOCOBALAMIN) 250 MCG TABLET    Take 500 mcg by mouth daily.  Modified Medications   No medications on file  Discontinued Medications     SIGNIFICANT DIAGNOSTIC EXAMS  10-06-15: ct of head: No acute intracranial abnormality. Atrophy, chronic microvascular disease. Severe degenerative changes throughout the cervical spine. No acute bony abnormality.  10-17-15: chest x-ray: Cardiomegaly and changes of congestive heart failure with improvement.  10-17-15: pelvic and bilateral hip x-ray: Acute left intertrochanteric fracture.     LABS REVIEWED:   10-23-15: wbc 7.7 ;hgb 9.1; hct 27.4; mcv 90.9; plt 157; glucose 89; bun 45; creat 5.09; k+ 3.3; na++133; phos 4.6;  albumin 2.0     Review of Systems  Constitutional: Negative for malaise/fatigue.  Respiratory: Negative for cough and shortness of breath.   Cardiovascular: Negative for chest pain.  Gastrointestinal: Negative for abdominal pain.  Musculoskeletal: Negative for myalgias and joint pain.  Skin:       Has bleeding from incision line   Psychiatric/Behavioral: The patient is not  nervous/anxious.      Physical Exam  Constitutional: No distress.  Thin   Eyes: Conjunctivae are normal.  Neck: Neck supple. No JVD present. No thyromegaly present.  Cardiovascular: Normal rate, regular rhythm and intact distal pulses.   Respiratory: Effort normal and breath sounds normal. No respiratory distress. He has no wheezes.  GI: Soft. Bowel sounds are normal. He exhibits no distension. There is no tenderness.  Musculoskeletal: He exhibits no edema.  Able to move all extremities  Is status post left hip fracture   Lymphadenopathy:    He has no cervical adenopathy.  Neurological: He is alert.  Skin: Skin is warm and dry. He is not diaphoretic.  Left hip incision line: swelling present; no signs of infection present. Is actively bleeding  Psychiatric: He has a normal mood and affect.       ASSESSMENT/ PLAN:  1. ESRD 2. Left hip fracture 3. Coagulopathy   Will get stat inr and ptt.  Will stop the heparin Will start asa 81 mg daily on 10-30-15 Will treat elevated ptt as indicated  Will discharged him to [facility] assisted living with hospice care. He will need a hospital bed due to his left hip pain as he requires frequent repositioning which cannot be achieved with a standard bed. He will need 02 at 2 liters/[place] in order to maintain his 02 sat >90 %. His prescriptions have been written for a 30 day supply of his medications with #30 vicodin 5/325 mg tabs. He will follow up medically with the provider at that facility.     Time spent with patient 45  minutes >50% time spent counseling; reviewing medical record; tests; labs; and developing future plan of care       Synthia Innocent NP [facility]  Contact [phone] Monday through Friday 8am- 5pm  After hours call [phone]

## 2015-11-08 DEATH — deceased

## 2017-11-16 IMAGING — CT CT CERVICAL SPINE W/O CM
3 of 4 series · 7 of 14 positions shown, 8 images · non-contrast
Comparison: 10/19/2014

CLINICAL DATA: Fall on concrete today. Laceration to occipital
region.

EXAM:
CT HEAD WITHOUT CONTRAST
CT CERVICAL SPINE WITHOUT CONTRAST
TECHNIQUE: Multidetector CT imaging of the head and cervical spine was
performed following the standard protocol without intravenous
contrast. Multiplanar CT image reconstructions of the cervical spine
were also generated.

[Series 3: c spine bone · axial · 0.37mm/px · z∈[-384,-328]mm · 2 of 86 slices shown]
[im 29/86  bone]
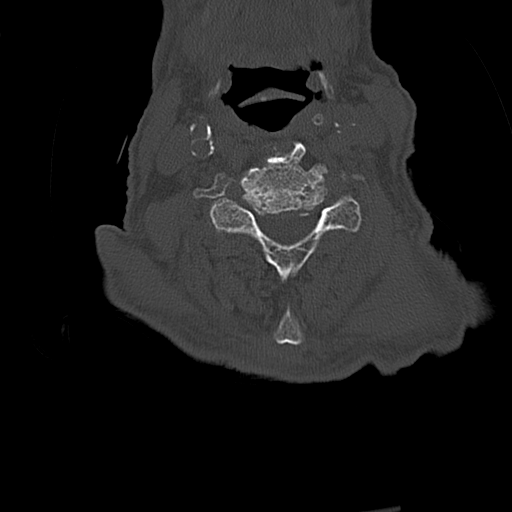
[im 57/86  bone]
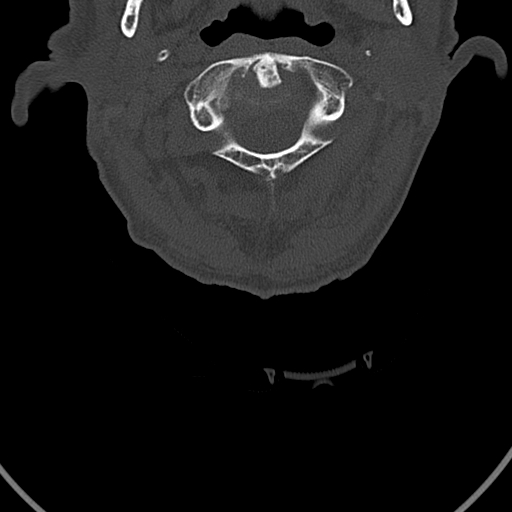

[Series 5: c spine soft · axial · 0.32mm/px · z∈[-446,-360]mm · 3 of 101 slices shown]
[im 26/101  soft-tissue]
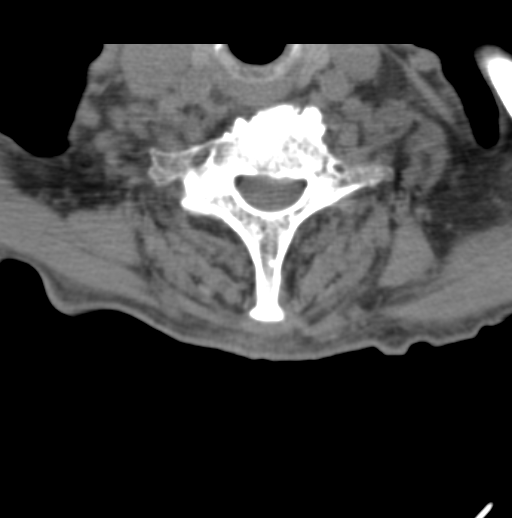
[im 51/101  soft-tissue]
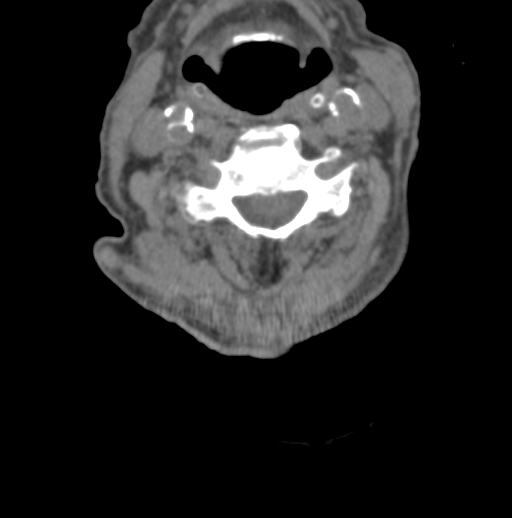
[im 76/101  soft-tissue]
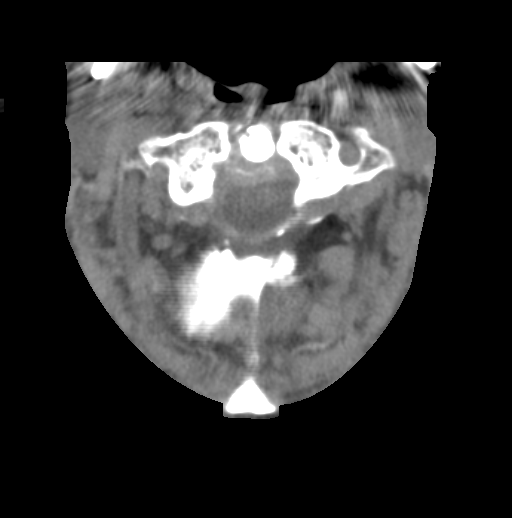

[Series 8: orthogonal axials · axial · 0.24mm/px · z∈[-427,-373]mm · 2 of 90 slices shown, 3 images]
[im 30/90  soft-tissue]
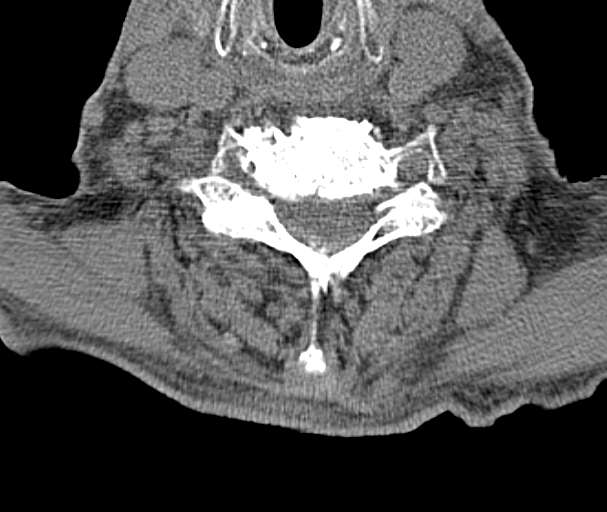
[im 30/90  bone]
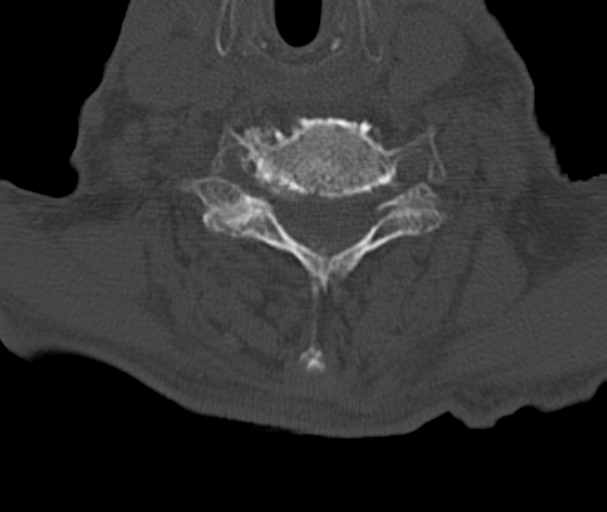
[im 60/90  bone]
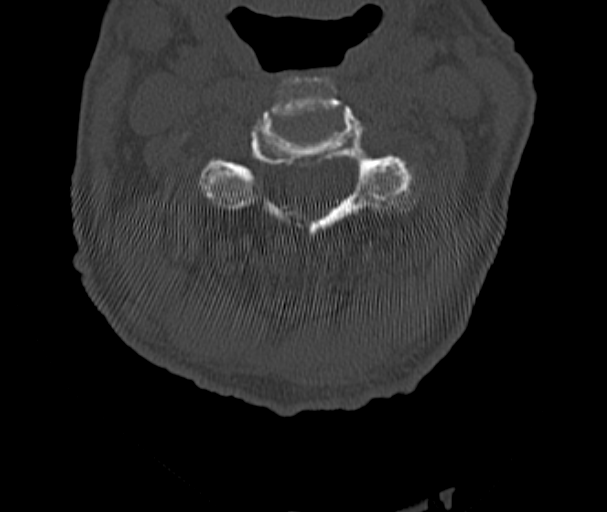

[7 of 14 positions shown; findings below may reference images not displayed]

FINDINGS: CT HEAD FINDINGS

There is atrophy and chronic small vessel disease changes. No acute
intracranial abnormality. Specifically, no hemorrhage,
hydrocephalus, mass lesion, acute infarction, or significant
intracranial injury. No acute calvarial abnormality. Visualized
paranasal sinuses and mastoids clear. Orbital soft tissues
unremarkable.

CT CERVICAL SPINE FINDINGS

Diffuse degenerative disc disease and facet disease throughout the
cervical spine. Prevertebral soft tissues are normal. No fracture.
No epidural or paraspinal hematoma.
IMPRESSION: No acute intracranial abnormality.

Atrophy, chronic microvascular disease.

Severe degenerative changes throughout the cervical spine. No acute
bony abnormality.
